# Patient Record
Sex: Female | Born: 1980 | Race: Black or African American | Hispanic: No | Marital: Married | Smoking: Never smoker
Health system: Southern US, Community
[De-identification: ages and names within clinical notes are randomized; demographics above are authoritative.]

## PROBLEM LIST (undated history)

## (undated) ENCOUNTER — Inpatient Hospital Stay (HOSPITAL_COMMUNITY): Payer: Self-pay

## (undated) DIAGNOSIS — I456 Pre-excitation syndrome: Secondary | ICD-10-CM

## (undated) HISTORY — PX: EYE SURGERY: SHX253

---

## 2005-11-13 DIAGNOSIS — I456 Pre-excitation syndrome: Secondary | ICD-10-CM

## 2005-11-13 HISTORY — PX: ABLATION: SHX5711

## 2005-11-13 HISTORY — DX: Pre-excitation syndrome: I45.6

## 2011-04-03 ENCOUNTER — Other Ambulatory Visit (HOSPITAL_COMMUNITY)
Admission: RE | Admit: 2011-04-03 | Discharge: 2011-04-03 | Disposition: A | Discharge status: Home / Self Care | Payer: BC Managed Care – PPO | Source: Ambulatory Visit | Attending: Obstetrics and Gynecology | Admitting: Obstetrics and Gynecology

## 2011-04-03 DIAGNOSIS — Z124 Encounter for screening for malignant neoplasm of cervix: Secondary | ICD-10-CM | POA: Insufficient documentation

## 2011-11-14 NOTE — L&D Delivery Note (Signed)
Delivery Note At 3:16 AM a non-viable female at 15 wks and 5 days  was delivered via Vaginal, Spontaneous Delivery (Presentation: ;  ).  APGAR: , ; weight .   Placenta status: intact, removed with ring forceps  .  Cord:  with the following complications: .  Cord pH: NA  Anesthesia: Epidural  Episiotomy: none Lacerations:None  Suture Repair: none Est. Blood Loss (mL): 250  Mom to to discharge from labor and delivery after 6 hours if stable .  Baby to NA.  Emily Coles J. 10/03/2012, 4:17 AM

## 2012-03-20 ENCOUNTER — Ambulatory Visit: Payer: BC Managed Care – PPO | Admitting: Family Medicine

## 2012-03-20 VITALS — BP 106/68 | HR 75 | Temp 97.9°F | Resp 18 | Ht 67.0 in | Wt 135.0 lb

## 2012-03-20 DIAGNOSIS — J029 Acute pharyngitis, unspecified: Secondary | ICD-10-CM

## 2012-03-20 DIAGNOSIS — J339 Nasal polyp, unspecified: Secondary | ICD-10-CM

## 2012-03-20 DIAGNOSIS — J00 Acute nasopharyngitis [common cold]: Secondary | ICD-10-CM

## 2012-03-20 DIAGNOSIS — J302 Other seasonal allergic rhinitis: Secondary | ICD-10-CM | POA: Insufficient documentation

## 2012-03-20 LAB — POCT RAPID STREP A (OFFICE): Rapid Strep A Screen: NEGATIVE

## 2012-03-20 MED ORDER — AZITHROMYCIN 250 MG PO TABS: ORAL_TABLET | ORAL | Status: AC

## 2012-03-20 NOTE — Patient Instructions (Signed)
Continue using the Allegra. Drink lots of fluids. Use the nose spray 2 sprays each nostril daily. Use alternative contraception while on antibiotics since she on birth control pills.  Return if worse.

## 2012-03-20 NOTE — Progress Notes (Signed)
Subjective: 31 year old with history of having felt bad since this weekend. Yesterday she had very bad sore throat. She feels a little better there today. She's been blowing bloody purulent mucus from her nose especially in the mornings. She been taking some OTC Allegra for her allergies. Ears do not bother her she has been coughing a fair amount. Says he wheezes on the mornings. She does have a history of annual springtime allergy.  Objective  pleasant Afro-American lady in no acute distress at this time. Her TMs are normal. Nose inflamed looking worse on the left than the right. Throat was clear strep screen taken neck supple without significant nodes. Chest clear to auscultation. Heart regular without a murmur scalp tenderness. 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  Assessment: Pharyngitis URI and cough History of WPW, treated with ablation  Plan:  Strep screen and then decide  Results for orders placed in visit on 03/20/12  POCT RAPID STREP A (OFFICE)      Component Value Range   Rapid Strep A Screen Negative  Negative    Rhinitis and sinusitis will be treated with a Z-Pak and Flonase. She is to continue the Allegra.

## 2012-04-25 ENCOUNTER — Other Ambulatory Visit (HOSPITAL_COMMUNITY)
Admission: RE | Admit: 2012-04-25 | Discharge: 2012-04-25 | Disposition: A | Discharge status: Home / Self Care | Payer: BC Managed Care – PPO | Source: Ambulatory Visit | Attending: Obstetrics & Gynecology | Admitting: Obstetrics & Gynecology

## 2012-04-25 DIAGNOSIS — Z1159 Encounter for screening for other viral diseases: Secondary | ICD-10-CM | POA: Insufficient documentation

## 2012-04-25 DIAGNOSIS — Z124 Encounter for screening for malignant neoplasm of cervix: Secondary | ICD-10-CM | POA: Insufficient documentation

## 2012-09-28 ENCOUNTER — Inpatient Hospital Stay (HOSPITAL_COMMUNITY)
Admission: AD | Admit: 2012-09-28 | Discharge: 2012-10-03 | DRG: 380 | Disposition: A | Discharge status: Home / Self Care | Payer: BC Managed Care – PPO | Source: Ambulatory Visit | Attending: Obstetrics & Gynecology | Admitting: Obstetrics & Gynecology

## 2012-09-28 ENCOUNTER — Encounter (HOSPITAL_COMMUNITY): Payer: Self-pay | Admitting: Obstetrics and Gynecology

## 2012-09-28 ENCOUNTER — Inpatient Hospital Stay (HOSPITAL_COMMUNITY): Payer: BC Managed Care – PPO

## 2012-09-28 DIAGNOSIS — O3430 Maternal care for cervical incompetence, unspecified trimester: Secondary | ICD-10-CM | POA: Diagnosis present

## 2012-09-28 DIAGNOSIS — O034 Incomplete spontaneous abortion without complication: Principal | ICD-10-CM | POA: Diagnosis not present

## 2012-09-28 DIAGNOSIS — Z332 Encounter for elective termination of pregnancy: Secondary | ICD-10-CM

## 2012-09-28 DIAGNOSIS — R109 Unspecified abdominal pain: Secondary | ICD-10-CM | POA: Diagnosis present

## 2012-09-28 DIAGNOSIS — K5900 Constipation, unspecified: Secondary | ICD-10-CM | POA: Diagnosis present

## 2012-09-28 DIAGNOSIS — D259 Leiomyoma of uterus, unspecified: Secondary | ICD-10-CM | POA: Diagnosis present

## 2012-09-28 DIAGNOSIS — O3410 Maternal care for benign tumor of corpus uteri, unspecified trimester: Secondary | ICD-10-CM | POA: Diagnosis present

## 2012-09-28 DIAGNOSIS — R630 Anorexia: Secondary | ICD-10-CM | POA: Diagnosis not present

## 2012-09-28 DIAGNOSIS — K219 Gastro-esophageal reflux disease without esophagitis: Secondary | ICD-10-CM | POA: Diagnosis not present

## 2012-09-28 HISTORY — DX: Pre-excitation syndrome: I45.6

## 2012-09-28 LAB — CBC
Hemoglobin: 11.1 g/dL — ABNORMAL LOW (ref 12.0–15.0)
MCH: 31.6 pg (ref 26.0–34.0)
MCV: 91.2 fL (ref 78.0–100.0)
RBC: 3.51 MIL/uL — ABNORMAL LOW (ref 3.87–5.11)
WBC: 7.8 10*3/uL (ref 4.0–10.5)

## 2012-09-28 LAB — CBC WITH DIFFERENTIAL/PLATELET
Basophils Absolute: 0 10*3/uL (ref 0.0–0.1)
HCT: 31.3 % — ABNORMAL LOW (ref 36.0–46.0)
Hemoglobin: 11 g/dL — ABNORMAL LOW (ref 12.0–15.0)
Lymphocytes Relative: 16 % (ref 12–46)
Monocytes Absolute: 0.9 10*3/uL (ref 0.1–1.0)
Monocytes Relative: 10 % (ref 3–12)
Neutro Abs: 6.6 10*3/uL (ref 1.7–7.7)
RDW: 12 % (ref 11.5–15.5)
WBC: 9 10*3/uL (ref 4.0–10.5)

## 2012-09-28 MED ORDER — ONDANSETRON HCL 4 MG/2ML IJ SOLN
4.0000 mg | Freq: Once | INTRAMUSCULAR | Status: AC
  Administered 2012-09-28: 4 mg via INTRAVENOUS
  Filled 2012-09-28: qty 2

## 2012-09-28 MED ORDER — ZOLPIDEM TARTRATE 5 MG PO TABS
5.0000 mg | ORAL_TABLET | Freq: Every evening | ORAL | Status: DC | PRN
  Administered 2012-09-29 – 2012-09-30 (×3): 5 mg via ORAL
  Filled 2012-09-28 (×3): qty 1

## 2012-09-28 MED ORDER — DOCUSATE SODIUM 100 MG PO CAPS
100.0000 mg | ORAL_CAPSULE | Freq: Every day | ORAL | Status: DC
  Administered 2012-09-29 – 2012-10-01 (×3): 100 mg via ORAL
  Filled 2012-09-28 (×4): qty 1

## 2012-09-28 MED ORDER — HYDROMORPHONE HCL PF 1 MG/ML IJ SOLN
0.5000 mg | Freq: Once | INTRAMUSCULAR | Status: AC
  Administered 2012-09-28: 0.5 mg via INTRAVENOUS
  Filled 2012-09-28: qty 1

## 2012-09-28 MED ORDER — CALCIUM CARBONATE ANTACID 500 MG PO CHEW: 2.0000 | CHEWABLE_TABLET | ORAL | Status: DC | PRN

## 2012-09-28 MED ORDER — SODIUM CHLORIDE 0.9 % IV BOLUS (SEPSIS)
1000.0000 mL | Freq: Once | INTRAVENOUS | Status: AC
  Administered 2012-09-28: 1000 mL via INTRAVENOUS

## 2012-09-28 MED ORDER — IBUPROFEN 600 MG PO TABS: 600.0000 mg | ORAL_TABLET | Freq: Four times a day (QID) | ORAL | Status: DC | PRN

## 2012-09-28 MED ORDER — IBUPROFEN 800 MG PO TABS
800.0000 mg | ORAL_TABLET | Freq: Three times a day (TID) | ORAL | Status: AC
  Administered 2012-09-29 – 2012-10-01 (×9): 800 mg via ORAL
  Filled 2012-09-28 (×9): qty 1

## 2012-09-28 MED ORDER — POLYETHYLENE GLYCOL 3350 17 G PO PACK
17.0000 g | PACK | Freq: Every day | ORAL | Status: DC
  Administered 2012-09-29 – 2012-09-30 (×2): 17 g via ORAL
  Filled 2012-09-28 (×6): qty 1

## 2012-09-28 MED ORDER — ACETAMINOPHEN 325 MG PO TABS: 650.0000 mg | ORAL_TABLET | ORAL | Status: DC | PRN

## 2012-09-28 MED ORDER — PRENATAL MULTIVITAMIN CH
1.0000 | ORAL_TABLET | Freq: Every day | ORAL | Status: DC
  Administered 2012-09-29 – 2012-10-02 (×4): 1 via ORAL
  Filled 2012-09-28 (×4): qty 1

## 2012-09-28 MED ORDER — DEXTROSE IN LACTATED RINGERS 5 % IV SOLN
INTRAVENOUS | Status: DC
  Administered 2012-09-29: 01:00:00 via INTRAVENOUS

## 2012-09-28 MED ORDER — HYDROMORPHONE HCL PF 1 MG/ML IJ SOLN
1.0000 mg | INTRAMUSCULAR | Status: DC | PRN
  Administered 2012-09-29: 1 mg via INTRAVENOUS
  Filled 2012-09-28: qty 1

## 2012-09-28 NOTE — MAU Provider Note (Signed)
History     CSN: [id number]  Arrival date and time: 09/28/12 1803   First Provider Initiated Contact with Patient 09/28/12 1834      Chief Complaint  Patient presents with  . Vaginal Bleeding   HPI Emily Roman is a 31 y.o. female @ [redacted]w[redacted]d gestation who presents to MAU via EMS with vaginal bleeding. The onset was sudden. She describes the bleeding as heavy with clots. Associated symptoms include severe abdominal pain and nausea. The history was provided by the patient.  OB History    Grav Para Term Preterm Abortions TAB SAB Ect Mult Living   1 0 0 0 0 0 0 0 0 0       Past Medical History  Diagnosis Date  . Wolff-Parkinson-White (WPW) syndrome     Past Surgical History  Procedure Date  . Eye surgery   . Ablation     History reviewed. No pertinent family history.  History  Substance Use Topics  . Smoking status: Never Smoker   . Smokeless tobacco: Not on file  . Alcohol Use: No    Allergies: No Known Allergies  Prescriptions prior to admission  Medication Sig Dispense Refill  . fexofenadine (ALLEGRA) 180 MG tablet Take 180 mg by mouth daily.        Review of Systems  Constitutional: Positive for malaise/fatigue. Negative for fever and chills.  Eyes: Negative for blurred vision and double vision.  Respiratory: Negative for cough and wheezing.   Cardiovascular: Negative for chest pain and palpitations.  Gastrointestinal: Positive for nausea and abdominal pain. Negative for vomiting, diarrhea and constipation.  Genitourinary: Positive for frequency. Negative for dysuria.       Vaginal bleeding  Musculoskeletal: Positive for back pain.  Skin: Negative for rash.  Neurological: Positive for dizziness. Negative for headaches.  Psychiatric/Behavioral: Negative for depression. The patient is not nervous/anxious.    Physical Exam   Blood pressure 126/72, pulse 103, temperature 98.3 F (36.8 C), temperature source Oral, resp. rate 20, last menstrual period  03/05/2012.  Physical Exam  Nursing note and vitals reviewed. Constitutional: She is oriented to person, place, and time. She appears well-developed and well-nourished. No distress.  HENT:  Head: Normocephalic and atraumatic.  Eyes: EOM are normal.  Neck: Neck supple.  Cardiovascular:       tachycardia  Respiratory: Effort normal.  GI: Soft. There is tenderness in the right upper quadrant, right lower quadrant, periumbilical area and left lower quadrant. There is guarding.       The abdomen is firm  Genitourinary:       External genitalia without lesions. Moderate blood vaginal vault. Cervix external os fingertip. Uterus firm, tender.  Neurological: She is alert and oriented to person, place, and time.  Skin: Skin is warm and dry.  Psychiatric: She has a normal mood and affect. Her behavior is normal. Judgment and thought content normal.   Results for orders placed during the hospital encounter of 09/28/12 (from the past 24 hour(s))  CBC WITH DIFFERENTIAL     Status: Abnormal   Collection Time   09/28/12  6:27 PM      Component Value Range   WBC 9.0  4.0 - 10.5 K/uL   RBC 3.42 (*) 3.87 - 5.11 MIL/uL   Hemoglobin 11.0 (*) 12.0 - 15.0 g/dL   HCT 40.9 (*) 81.1 - 91.4 %   MCV 91.5  78.0 - 100.0 fL   MCH 32.2  26.0 - 34.0 pg   MCHC 35.1  30.0 -  36.0 g/dL   RDW 16.1  09.6 - 04.5 %   Platelets 267  150 - 400 K/uL   Neutrophils Relative 73  43 - 77 %   Neutro Abs 6.6  1.7 - 7.7 K/uL   Lymphocytes Relative 16  12 - 46 %   Lymphs Abs 1.5  0.7 - 4.0 K/uL   Monocytes Relative 10  3 - 12 %   Monocytes Absolute 0.9  0.1 - 1.0 K/uL   Eosinophils Relative 0  0 - 5 %   Eosinophils Absolute 0.0  0.0 - 0.7 K/uL   Basophils Relative 0  0 - 1 %   Basophils Absolute 0.0  0.0 - 0.1 K/uL  ABO/RH     Status: Normal   Collection Time   09/28/12  6:27 PM      Component Value Range   ABO/RH(D) O POS    WET PREP, GENITAL     Status: Abnormal   Collection Time   09/28/12  6:50 PM      Component  Value Range   Yeast Wet Prep HPF POC NONE SEEN  NONE SEEN   Trich, Wet Prep NONE SEEN  NONE SEEN   Clue Cells Wet Prep HPF POC NONE SEEN  NONE SEEN   WBC, Wet Prep HPF POC MODERATE BACTERIA SEEN (*) NONE SEEN   MAU Course: discussed with Dr. Dion Body on call for Dr. Christell Constant. We will repeat the CBC and she will evaluate the patient in MAU.  Procedures  Assessment: 31 y.o. female @ [redacted]w[redacted]d with heavy vaginal bleeding   Abdominal pain.  NEESE,HOPE, RN, FNP, [facility] 09/28/2012, 6:37 PM

## 2012-09-28 NOTE — MAU Note (Signed)
Pt presents to MAU via EMS with chief complaint of vaginal bleeding. Pt is 15w pregnancy; says around 1700 she felt a gush of blood and called 911. Pt has not been feeling well since Monday due to pain from fibroids.

## 2012-09-28 NOTE — H&P (Signed)
Emily Roman is a 31 y.o. female G1 at 52 0/7 weeks presenting with a c/o heavy vaginal bleeding.  Pt has a h/o multiple (7) large fibroids.  The largest is 12 cm.    She reports a sudden gush of blood tonight when she was urinating.  Bleeding continued pretty heavily so she called EMS.  She initially reports dizziness at home but denies it upon arrival to hospital. She has been having pain for 7 days.  She initially took Ibuprofen that helped but discontinued by her primary OB, Dr. Christell Constant with [facility].  She was given Percocet 4 days ago, which did not help with pain.  Pt reports when she moves it 8/10 pain, if she is still 4/10 pain.  She got IV Dilaudid which brought the pain down to 5/10.  Husband states she pt has slept on the couch for the last 4 nights.  He states she could not stand up straight when walking.  He rolled her to the bathroom in a chair. She reports she has not eaten in the last 5 days due to decreased appetite.  She states it is due to pain.  Denies nausea or vomiting.  She denies fever or chills.   Pt was previously scheduled for a laparoscopic myomectomy prior to spontaneous conception.  Besides the fibroids, she has not had any more issues.  Prenatal record is not available for review.  Maternal Medical History:  Reason for admission: Reason for admission: vaginal bleeding.  Reason for Admission:   nauseaPrenatal complications: Large uterine fibroids.  Prenatal Complications - Diabetes: none.    OB History    Grav Para Term Preterm Abortions TAB SAB Ect Mult Living   1 0 0 0 0 0 0 0 0 0      Past Medical History  Diagnosis Date  . Wolff-Parkinson-White (WPW) syndrome    Past Surgical History  Procedure Date  . Eye surgery   . Ablation    Family History: family history is not on file. Social History:  reports that she has never smoked. She does not have any smokeless tobacco history on file. She reports that she does not drink alcohol or use illicit  drugs.   Review of Systems  Constitutional: Negative for fever and chills.  Respiratory: Negative for shortness of breath.   Cardiovascular: Negative for palpitations.  Gastrointestinal: Positive for abdominal pain and constipation. Negative for nausea and vomiting.       Decreased appetite x 5 days.  Neurological: Positive for dizziness.  Psychiatric/Behavioral: The patient is not nervous/anxious.     Dilation: Fingertip Exam by:: Ivonne Andrew, CNM Blood pressure 109/65, pulse 99, temperature 98.3 F (36.8 C), temperature source Oral, resp. rate 20, last menstrual period 03/05/2012, SpO2 99.00%. Maternal Exam:  Abdomen: Fetal presentation: breech  Introitus: Normal vulva. Normal vagina.  Ferning test: negative.   Cervix: Cervix evaluated by sterile speculum exam and digital exam.     Fetal Exam Fetal Monitor Review: Mode: ultrasound.   Baseline rate: 160s.      Physical Exam  Constitutional: She is oriented to person, place, and time. She appears well-developed and well-nourished. No distress.       Pt right leg occasionally twitches which she reports is due to pain.  HENT:  Head: Normocephalic and atraumatic.  Cardiovascular: Normal rate, regular rhythm and normal heart sounds.   No murmur heard. Respiratory: Effort normal and breath sounds normal. She has no wheezes. She exhibits no tenderness.  GI: She exhibits  distension and mass. There is tenderness.       Pt is exquisitely tender over a large right fundal fibroid.  Musculoskeletal: Normal range of motion. She exhibits no edema.  Neurological: She is alert and oriented to person, place, and time.  Skin: Skin is warm and dry. She is not diaphoretic.  Psychiatric:       Pt is very calm.    Cervix is visually and digitally closed.  Mucus noted at os.  No active bleeding and no blood in vault.  Small dark clots noted on pad under pt. Bedside ultrasound done to assess area where pt has the most pain.  10 cm fibroid  noted.  6.5 cm noted in left mid uterus. Breech fetus with cardiac activity. I reviewed previous ultrasound images.  Funneling of internal os is obvious.  Foot through internal os.    Hg stable 11.1 to 11.2 over 3 hours. Assessment/Plan: IUP at 15 0/7 weeks Vaginal bleeding, acute but now has slowed.  Hg stable, vital signs are stable.  Suspect abruption, labor or subchorionic hemorrhage. Large uterine fibroids, likely degenerating, which is cause of pain. 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 Poor po intake due to pain per pt.  In that pain is RLQ, consider appendicitis.  B/c pt is very sure about location of pain which is over fibroid, appendicitis is less likely.  Currently afebrile. Constipation.  Due to narcotics.  Admit for observation and pain control. Ibuprofen 800 mg q 8 hours x 72 hours for pain.  Dilaudid for severe pain. DIC panel, KB stain, T&S. Strict bedrest. Miralax for now for constipation.  Dulcolax prn. If pain is uncontrolled or pt becomes febrile, consider CT scan.  Discussed with pt and husband plan of care.  Not a candidate for cerclage due to bleeding.  Poor prognosis given bleeding, large fibroids and funneling. Goal is to get her comfortable for now and expectantly manage. Plan reviewed with Dr. Rachel Bo, MFM on call.  Agrees with management plan.      Geryl Rankins 09/28/2012, 11:11 PM

## 2012-09-29 LAB — DIC (DISSEMINATED INTRAVASCULAR COAGULATION)PANEL
Fibrinogen: 599 mg/dL — ABNORMAL HIGH (ref 204–475)
Prothrombin Time: 13.6 seconds (ref 11.6–15.2)
Smear Review: NONE SEEN

## 2012-09-29 LAB — TYPE AND SCREEN: ABO/RH(D): O POS

## 2012-09-29 LAB — CBC WITH DIFFERENTIAL/PLATELET
Eosinophils Absolute: 0.1 10*3/uL (ref 0.0–0.7)
Eosinophils Relative: 2 % (ref 0–5)
Hemoglobin: 10.5 g/dL — ABNORMAL LOW (ref 12.0–15.0)
Lymphs Abs: 1.2 10*3/uL (ref 0.7–4.0)
MCH: 31.4 pg (ref 26.0–34.0)
MCV: 91 fL (ref 78.0–100.0)
Monocytes Relative: 10 % (ref 3–12)
RBC: 3.34 MIL/uL — ABNORMAL LOW (ref 3.87–5.11)

## 2012-09-29 LAB — COMPREHENSIVE METABOLIC PANEL
AST: 11 U/L (ref 0–37)
Albumin: 2.4 g/dL — ABNORMAL LOW (ref 3.5–5.2)
Calcium: 8.7 mg/dL (ref 8.4–10.5)
Creatinine, Ser: 0.65 mg/dL (ref 0.50–1.10)
Sodium: 132 mEq/L — ABNORMAL LOW (ref 135–145)
Total Protein: 6.1 g/dL (ref 6.0–8.3)

## 2012-09-29 LAB — URINALYSIS, ROUTINE W REFLEX MICROSCOPIC
Glucose, UA: NEGATIVE mg/dL
Leukocytes, UA: NEGATIVE
Nitrite: NEGATIVE
Protein, ur: NEGATIVE mg/dL

## 2012-09-29 LAB — KLEIHAUER-BETKE STAIN
Fetal Cells %: 0 %
Quantitation Fetal Hemoglobin: 0 mL

## 2012-09-29 MED ORDER — FAMOTIDINE IN NACL 20-0.9 MG/50ML-% IV SOLN
20.0000 mg | Freq: Once | INTRAVENOUS | Status: AC
  Administered 2012-09-29: 20 mg via INTRAVENOUS
  Filled 2012-09-29: qty 50

## 2012-09-29 MED ORDER — LACTATED RINGERS IV SOLN
INTRAVENOUS | Status: DC
  Administered 2012-09-29 – 2012-10-01 (×6): via INTRAVENOUS

## 2012-09-29 MED ORDER — FAMOTIDINE 20 MG PO TABS
20.0000 mg | ORAL_TABLET | Freq: Two times a day (BID) | ORAL | Status: DC
  Administered 2012-09-29 – 2012-10-02 (×5): 20 mg via ORAL
  Filled 2012-09-29 (×5): qty 1

## 2012-09-29 NOTE — Progress Notes (Addendum)
Patient ID: Emily Roman, female   DOB: [birth year], 31 y.o.   MRN: [id number]  Pt states pain is decreasing.  She has not used Dilaudid this am.  It still hurts to move or laugh on her right side near umbilicus. Passed a small dark clot this am but she has not any more bright red blood.  Not wearing a pad.  She ate a light breakfast this am and is drinking.  Starting to have flatus but has not had BM yet.  Denies n/v but some discomfort with swallowing.   AF VSS.  HR 119. FHTS: 140s per RN. Gen:  NAD, feels much more comfortable.   CV:  RRR, tachycardia, no murmer Lungs:  CTA bilaterally Abodmen:  Still tender over the mid right abdomen over fibroid but significantly improved.  She is now able to tolerate mild palpation.Still distended but significantly less taut, active BS.   Ext:  TED hose on.  No calf tenderness. Perineum:  No blood noted. Small dark clot less than 1 cm visualized on washcloth.  A/P:   IUP at 15 1/7 weeks- Large fibroid uterus with vaginal bleeding and severe abdominal pain.  Pt clinically appears much better.  Pain likely due to degenerating fibroid.    -Pain control is improving with Ibuprofen scheduled.  Continue to complete 72 hours. Vaginal bleeding has stopped.  Labs did not rule in abruption.    -Continue observation, pad count. IC-  No signs of progressing cervical dilatation.   -MFM consult tomorrow to reevaluate cervical length, funneling. Anorexia improving.  Tolerating po.    -Cont IVF, change to LR.  Saline lock when po normalizes. Constipation.  +Flatus but no BM yet.    Cont IVF and Miralax.  Consider Dulcolax suppository this evening prn. Possible GERD.  Start Pepcid. Tachycardia at rest.  Check CBC. Continue strict bedrest until further recommendations from MFM or Dr. Christell Constant. Plan discussed at length with pt, husband and family at bedside.  All questions answered.  Dr. Christell Constant to resume care tomorrow am.

## 2012-09-29 NOTE — Progress Notes (Signed)
Report given to Lowella Grip, RN. Current RN sent to MAU due to low census.

## 2012-09-29 NOTE — Progress Notes (Addendum)
Patient ID: Emily Roman, female   DOB: 14-May-1981, 31 y.o.   MRN: [id number]  At bedside in L&D to discuss MFM recommendations and management plan. Also discussed risk of retained placenta and possibility of D&C for retained placenta. All questions answered.' Pt reports epigastric pain when drinking Sprite.  "Hurts as it is going through." Coags normal. KB negative. UA neg except trace Ketones. Add on CMP.

## 2012-09-30 ENCOUNTER — Ambulatory Visit (HOSPITAL_COMMUNITY)
Admission: RE | Admit: 2012-09-30 | Payer: BC Managed Care – PPO | Source: Ambulatory Visit | Admitting: Obstetrics and Gynecology

## 2012-09-30 ENCOUNTER — Encounter (HOSPITAL_COMMUNITY): Admission: RE | Payer: Self-pay | Source: Ambulatory Visit

## 2012-09-30 ENCOUNTER — Inpatient Hospital Stay (HOSPITAL_COMMUNITY): Payer: BC Managed Care – PPO

## 2012-09-30 LAB — GC/CHLAMYDIA PROBE AMP, GENITAL: Chlamydia, DNA Probe: NEGATIVE

## 2012-09-30 SURGERY — LAPAROSCOPY OPERATIVE
Anesthesia: General

## 2012-09-30 NOTE — Consult Note (Signed)
Maternal Fetal Medicine Consultation  Requesting Provider(s): Filbert Berthold, MD  Reason for consultation: vaginal bleeding, shortened cervix, abdominal pain  HPI: Emily Roman is a 31 yo G1P0 currently at 15 2/7 weeks who was admitted over the weekend for abdominal pain and vaginal bleeding.  She reports that over the last week, she has had progressively worsening right-sided abdominal pain that is associated with any activity. Her appetite has been poor.  She was seen by Dr. Christell Constant last Wednesday and given a script for Percocet with only a little improvement.  On the day of admission (2 days ago) she reported worsening abdominal pain and "heavy" vaginal bleeding with passage of some clots.  Since admission, her pain has improved somewhat and the vaginal bleeding has almost resolved - now with mostly mucous discharge.  The patient's prenatal course has been complicated by fibroid uterus - has a 10 x 10 cm right-sided fibroid that has been the source of pain.  Prenatal course has otherwise been uncomplicated.  OB History: OB History    Grav Para Term Preterm Abortions TAB SAB Ect Mult Living   1 0 0 0 0 0 0 0 0 0       PMH:  Past Medical History  Diagnosis Date  . Wolff-Parkinson-White (WPW) syndrome     PSH:  Past Surgical History  Procedure Date  . Eye surgery   . Ablation    Meds:  Scheduled Meds:   . docusate sodium  100 mg Oral Daily  . [COMPLETED] famotidine (PEPCID) IV  20 mg Intravenous Once  . famotidine  20 mg Oral BID  . ibuprofen  800 mg Oral Q8H  . polyethylene glycol  17 g Oral Daily  . prenatal multivitamin  1 tablet Oral Daily   Continuous Infusions:   . dextrose 5% lactated ringers 125 mL/hr at 09/29/12 0108  . lactated ringers 125 mL/hr at 09/30/12 0616   PRN Meds:.acetaminophen, calcium carbonate, HYDROmorphone, zolpidem  Allergies: NKDA  FH:  Denies family history of birth defects or hereditary disorders  Soc: Non drinker, non smoker, denies illicit  drug use  Review of Systems: denies contractions, no LOF, no nausea/vomiting. All other systems reviewed and are negative.   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  Some debris noted in the amniotic fluid.               Unable to measure cervical length.  Labs: CBC    Component Value Date/Time   WBC 6.8 09/29/2012 1326   RBC 3.34* 09/29/2012 1326   HGB 10.5* 09/29/2012 1326   HCT 30.4* 09/29/2012 1326   PLT 248 09/29/2012 1326   MCV 91.0 09/29/2012 1326   MCH 31.4 09/29/2012 1326   MCHC 34.5 09/29/2012 1326   RDW 11.8 09/29/2012 1326   LYMPHSABS 1.2 09/29/2012 1326   MONOABS 0.7 09/29/2012 1326   EOSABS 0.1 09/29/2012 1326   BASOSABS 0.0 09/29/2012 1326     A/P: 1) IUP at 15 2/7 weeks         2) Vaginal bleeding  3) Fibroid uterus         4) RLQ abdominal pain - likely secondary to degenerating fibroids; with normal white count, doubt appendicitis         5) Shortened cervix - possible cervical insufficiency  Recommendations: Findings and limitations of the ultrasound were discussed with the patient.  With vaginal bleeding and abdominal pain, do not feel that the patient is a candidate for rescue cerclage and the risk of harm outweighs potential benefits.  Recommend continued expectant management.  We discussed the guarded prognosis for the fetus given ultrasound findings and risk of infection, PROM and likely preterm / previable delivery.  The patient's vaginal bleeding has improved - currently hemodynamically stable.  Feel that the most likely etiology of the  patient's abdominal pain is due to degenerating fibroids.  May continue Motrin for 48-72 hours (total).  Alternatively, could try a single dose of IV Toradol - but again would restrict dose of NSAIDs to maximum of 72 hours.  With a normal WBC count, feel that likelihood of appendicitis is low, but would consider General Surgery consult / CT scan if symptoms worsen.  If able to get pain under better control, would consider hospital discharge with close outpatient management / frequent temp checks at home.  Would consider readmission once viability is reached (approx 23-24 weeks) for observation and betamethasone.  Would consider myomectomy as an interval procedure - would recommend cervical length surveillance due to possible cervical insufficiency in future pregnancies.  Thank you for the opportunity to be a part of the care of [employer]. Please contact our office if we can be of further assistance.   I spent approximately 30 minutes with this patient with over 50% of time spent in face-to-face counseling.  Alpha Gula, MD

## 2012-09-30 NOTE — Progress Notes (Signed)
Hospital Day: 3  S: Preterm labor symptoms: No complaints of bleeding today.  Still has pain, especially if she moves around much.  Ibuprofen 800 mg helps.  O: Blood pressure 119/81, pulse 85, temperature 98.4 F (36.9 C), temperature source Oral, resp. rate 18, height 5\' 7"  (1.702 m), weight 67.132 kg (148 lb), last menstrual period 03/05/2012, SpO2 99.00%.   FHT:wnl Toco: None [id number]: Fingertip Exam by:: Ivonne Andrew, CNM  A/P- 31 y.o. admitted with 15 + wk pregnancy, pain, bleeding.  Pt with massive fibroids, probable degenerating fibroid. Dr Sondra Barges note appreciated.  Will continue present plan of bedrest and pain control.  Discharge plans per Dr Christell Constant.  Patient Active Hospital Problem List: No active hospital problems.   Pregnancy Complications: degenerating fibroid  Preterm labor management: no treatment necessary ROD: spontaneous vaginal

## 2012-10-01 MED ORDER — SODIUM CHLORIDE 0.9 % IJ SOLN
3.0000 mL | Freq: Two times a day (BID) | INTRAMUSCULAR | Status: DC
  Administered 2012-10-01: 3 mL via INTRAVENOUS

## 2012-10-01 NOTE — Progress Notes (Addendum)
Patient ID: Emily Roman, female   DOB: [birth year], 31 y.o.   MRN: [id number] Hospital Day: 4  S: Preterm labor symptoms: of RLQ pain but no pain when still and was able to sleep last evening but when she moves she states the pain is severe.  She also states that she is scared to go home and we discussed the difficulty with her situation. We will let her process this difficult news of the poor prognosis.  We are also going to have to stop the motrin after 72 hours. Not eating well.    O: Blood pressure 122/71, pulse 84, temperature 98 F (36.7 C), temperature source Oral, resp. rate 18, height 5\' 7"  (1.702 m), weight 67.132 kg (148 lb), last menstrual period 03/05/2012, SpO2 99.00%.   [id number] Toco: None [id number]: Fingertip Exam by:: Ivonne Andrew, CNM Appears anxious and upset but was awakened from sleep Abdomen nonacute and tender where the palpable fibroid is located Stockings on  Scheduled Meds:   . docusate sodium  100 mg Oral Daily  . famotidine  20 mg Oral BID  . ibuprofen  800 mg Oral Q8H  . polyethylene glycol  17 g Oral Daily  . prenatal multivitamin  1 tablet Oral Daily   Continuous Infusions:   . dextrose 5% lactated ringers 125 mL/hr at 09/29/12 0108  . lactated ringers 125 mL/hr at 10/01/12 0541   PRN Meds:.acetaminophen, calcium carbonate, HYDROmorphone, zolpidem  CBC    Component Value Date/Time   WBC 6.8 09/29/2012 1326   RBC 3.34* 09/29/2012 1326   HGB 10.5* 09/29/2012 1326   HCT 30.4* 09/29/2012 1326   PLT 248 09/29/2012 1326   MCV 91.0 09/29/2012 1326   MCH 31.4 09/29/2012 1326   MCHC 34.5 09/29/2012 1326   RDW 11.8 09/29/2012 1326   LYMPHSABS 1.2 09/29/2012 1326   MONOABS 0.7 09/29/2012 1326   EOSABS 0.1 09/29/2012 1326   BASOSABS 0.0 09/29/2012 1326      A/P- 31 y.o. admitted with:  Patient Active Hospital Problem List: No active hospital problems.   Pregnancy Complications: IUP at 15w3  Fibroid uterus H/o WPW ablated Right sided pain  secondary to myoma  Shortened cervix??    Pain control Work toward outpatient management Encourage ambulation    Preterm labor management: pain medication Dating:  [redacted]w[redacted]d PNL Needed:  none

## 2012-10-01 NOTE — Progress Notes (Signed)
10/01/12 1100  Clinical Encounter Type  Visited With Patient  Visit Type Initial;Spiritual support;Social support  Recommendations Spiritual Care will follow for support.  Spiritual Encounters  Spiritual Needs Emotional  Stress Factors  Patient Stress Factors Loss of control;Major life changes (threatened pregnancy)    Made lovely initial visit with Emily Roman at her bedside RN's recommendation.  Emily Roman was very frank and open about her situation and feelings, stating that she has good support from family (husband, inlaws) and friends (including present coworkers from [employer] and past coworkers from [employer]), but that she's really struggling with the prognosis and the emotional limbo.  She appears to have strong communication skills and good self-awareness as she tries to balance hope for, and anxiety about losing, this much-wanted pregnancy.    I provided witness to her story, empathic listening, affirmation, and intro to chaplain services and availability.  Will continue to follow for support and encouraged [place] to reach out, too, as desired through this and possible future admissions.  [address] [place], [place] [phone]

## 2012-10-02 ENCOUNTER — Inpatient Hospital Stay (HOSPITAL_COMMUNITY): Payer: BC Managed Care – PPO | Admitting: Anesthesiology

## 2012-10-02 ENCOUNTER — Encounter (HOSPITAL_COMMUNITY): Payer: Self-pay | Admitting: Anesthesiology

## 2012-10-02 LAB — TYPE AND SCREEN: ABO/RH(D): O POS

## 2012-10-02 LAB — CBC
Hemoglobin: 11.8 g/dL — ABNORMAL LOW (ref 12.0–15.0)
MCH: 31.1 pg (ref 26.0–34.0)
RBC: 3.79 MIL/uL — ABNORMAL LOW (ref 3.87–5.11)
WBC: 7.1 10*3/uL (ref 4.0–10.5)

## 2012-10-02 MED ORDER — LACTATED RINGERS IV SOLN
500.0000 mL | Freq: Once | INTRAVENOUS | Status: AC
  Administered 2012-10-02: 500 mL via INTRAVENOUS

## 2012-10-02 MED ORDER — FENTANYL 2.5 MCG/ML BUPIVACAINE 1/10 % EPIDURAL INFUSION (WH - ANES)
14.0000 mL/h | INTRAMUSCULAR | Status: DC
  Administered 2012-10-02: 14 mL/h via EPIDURAL
  Filled 2012-10-02: qty 125

## 2012-10-02 MED ORDER — OXYCODONE-ACETAMINOPHEN 5-325 MG PO TABS
1.0000 | ORAL_TABLET | ORAL | Status: DC | PRN
  Administered 2012-10-03: 2 via ORAL
  Filled 2012-10-02: qty 2

## 2012-10-02 MED ORDER — ACETAMINOPHEN 325 MG PO TABS: 650.0000 mg | ORAL_TABLET | ORAL | Status: DC | PRN

## 2012-10-02 MED ORDER — ONDANSETRON HCL 4 MG/2ML IJ SOLN: 4.0000 mg | Freq: Four times a day (QID) | INTRAMUSCULAR | Status: DC | PRN

## 2012-10-02 MED ORDER — LIDOCAINE HCL (PF) 1 % IJ SOLN: 30.0000 mL | INTRAMUSCULAR | Status: DC | PRN

## 2012-10-02 MED ORDER — EPHEDRINE 5 MG/ML INJ: 10.0000 mg | INTRAVENOUS | Status: DC | PRN

## 2012-10-02 MED ORDER — DIPHENHYDRAMINE HCL 50 MG/ML IJ SOLN: 12.5000 mg | INTRAMUSCULAR | Status: DC | PRN

## 2012-10-02 MED ORDER — LIDOCAINE HCL (PF) 1 % IJ SOLN
INTRAMUSCULAR | Status: DC | PRN
  Administered 2012-10-02 (×3): 4 mL

## 2012-10-02 MED ORDER — PHENYLEPHRINE 40 MCG/ML (10ML) SYRINGE FOR IV PUSH (FOR BLOOD PRESSURE SUPPORT): 80.0000 ug | PREFILLED_SYRINGE | INTRAVENOUS | Status: DC | PRN

## 2012-10-02 MED ORDER — CITRIC ACID-SODIUM CITRATE 334-500 MG/5ML PO SOLN: 30.0000 mL | ORAL | Status: DC | PRN

## 2012-10-02 MED ORDER — OXYTOCIN BOLUS FROM INFUSION: 500.0000 mL | INTRAVENOUS | Status: DC

## 2012-10-02 MED ORDER — LACTATED RINGERS IV SOLN
INTRAVENOUS | Status: DC
  Administered 2012-10-02 (×2): via INTRAVENOUS

## 2012-10-02 MED ORDER — LACTATED RINGERS IV SOLN: 500.0000 mL | INTRAVENOUS | Status: DC | PRN

## 2012-10-02 MED ORDER — TERBUTALINE SULFATE 1 MG/ML IJ SOLN: 0.2500 mg | Freq: Once | INTRAMUSCULAR | Status: AC | PRN

## 2012-10-02 MED ORDER — OXYTOCIN 40 UNITS IN LACTATED RINGERS INFUSION - SIMPLE MED: 62.5000 mL/h | INTRAVENOUS | Status: DC

## 2012-10-02 MED ORDER — EPHEDRINE 5 MG/ML INJ
10.0000 mg | INTRAVENOUS | Status: DC | PRN
  Filled 2012-10-02: qty 4

## 2012-10-02 MED ORDER — OXYCODONE-ACETAMINOPHEN 5-325 MG PO TABS
1.0000 | ORAL_TABLET | ORAL | Status: DC | PRN
  Administered 2012-10-02: 1 via ORAL
  Filled 2012-10-02: qty 1

## 2012-10-02 MED ORDER — PHENYLEPHRINE 40 MCG/ML (10ML) SYRINGE FOR IV PUSH (FOR BLOOD PRESSURE SUPPORT)
80.0000 ug | PREFILLED_SYRINGE | INTRAVENOUS | Status: DC | PRN
  Filled 2012-10-02: qty 5

## 2012-10-02 MED ORDER — MISOPROSTOL 200 MCG PO TABS
800.0000 ug | ORAL_TABLET | Freq: Once | ORAL | Status: AC
  Administered 2012-10-02: 800 ug via VAGINAL
  Filled 2012-10-02: qty 4

## 2012-10-02 MED ORDER — IBUPROFEN 600 MG PO TABS
600.0000 mg | ORAL_TABLET | Freq: Four times a day (QID) | ORAL | Status: DC | PRN
  Administered 2012-10-03: 600 mg via ORAL
  Filled 2012-10-02: qty 1

## 2012-10-02 NOTE — Anesthesia Procedure Notes (Signed)
Epidural Patient location during procedure: OB Start time: 10/02/2012 10:23 PM  Staffing Performed by: anesthesiologist   Preanesthetic Checklist Completed: patient identified, site marked, surgical consent, pre-op evaluation, timeout performed, IV checked, risks and benefits discussed and monitors and equipment checked  Epidural Patient position: sitting Prep: site prepped and draped and DuraPrep Patient monitoring: continuous pulse ox and blood pressure Approach: midline Injection technique: LOR air  Needle:  Needle type: Tuohy  Needle gauge: 17 G Needle length: 9 cm and 9 Needle insertion depth: 4 cm Catheter type: closed end flexible Catheter size: 19 Gauge Catheter at skin depth: 9 cm Test dose: negative  Assessment Events: blood not aspirated, injection not painful, no injection resistance, negative IV test and no paresthesia  Additional Notes Discussed risk of headache, infection, bleeding, nerve injury and failed or incomplete block.  Patient voices understanding and wishes to proceed. Reason for block:procedure for pain

## 2012-10-02 NOTE — Progress Notes (Signed)
Pt states feels fluid leaking everytime she moves

## 2012-10-02 NOTE — Anesthesia Preprocedure Evaluation (Addendum)
Anesthesia Evaluation  Patient identified by MRN, date of birth, ID band Patient awake    Reviewed: Allergy & Precautions, H&P , NPO status , Patient's Chart, lab work & pertinent test results, reviewed documented beta blocker date and time   History of Anesthesia Complications Negative for: history of anesthetic complications  Airway Mallampati: III TM Distance: >3 FB Neck ROM: full    Dental  (+) Teeth Intact   Pulmonary neg pulmonary ROS,  breath sounds clear to auscultation        Cardiovascular + dysrhythmias (h/o WPW, s/p ablation in 2007, asymptomatic since) Rhythm:regular Rate:Normal     Neuro/Psych negative neurological ROS  negative psych ROS   GI/Hepatic negative GI ROS, Neg liver ROS,   Endo/Other  negative endocrine ROS  Renal/GU negative Renal ROS     Musculoskeletal   Abdominal   Peds  Hematology negative hematology ROS (+)   Anesthesia Other Findings   Reproductive/Obstetrics (+) Pregnancy (15 weeks,SROM)                          Anesthesia Physical Anesthesia Plan  ASA: II  Anesthesia Plan: Epidural   Post-op Pain Management:    Induction:   Airway Management Planned:   Additional Equipment:   Intra-op Plan:   Post-operative Plan:   Informed Consent: I have reviewed the patients History and Physical, chart, labs and discussed the procedure including the risks, benefits and alternatives for the proposed anesthesia with the patient or authorized representative who has indicated his/her understanding and acceptance.     Plan Discussed with:   Anesthesia Plan Comments:         Anesthesia Quick Evaluation

## 2012-10-02 NOTE — Progress Notes (Signed)
Pt denies any leaking

## 2012-10-02 NOTE — Progress Notes (Signed)
Oline Belk is a 31 y.o. G1P0000 at [redacted]w[redacted]d by [place] and has been admitted for fibroid pain  Subjective: Today pt had episode of leaking and amnisure was positive and the pt had leaked which was by report grossly appearing like amniiotic  fluiid as well.   The pt states she has stopped leaking and the FHR is still present and we discussed options.  We talked about the very poor prognosis and the best recommendation to proceed with delivery with expectation of what we may find.  We did discuss the possibilities of D&C to remove the pregnancy and or the placenta and or the potential for uterine rupture secondary to being slightly increased with the myomas.    We also discussed potential for bleeding and potential for transfusion and the risk of HIV , hepatitis and transfusion reaction.  The potential for D&C for this as well.  We discussed pain control with the CLE and she agrees.  We discussed the fact that we did not need abx at this point but they may be required as the time is the factor.    Objective: BP 110/67  Pulse 93  Temp 98.3 F (36.8 C) (Oral)  Resp 18  Ht 5\' 7"  (1.702 m)  Wt 65.046 kg (143 lb 6.4 oz)  BMI 22.46 kg/m2  SpO2 99%  LMP 03/05/2012 I/O last 3 completed shifts: In: -  Out: 7 [Urine:6; Stool:1] Total I/O In: -  Out: 400 [Urine:400]  FHT:  present UC:  SVE:   Dilation: Fingertip Exam by:: Ivonne Andrew, CNM  Labs: Lab Results  Component Value Date   WBC 6.8 09/29/2012   HGB 10.5* 09/29/2012   HCT 30.4* 09/29/2012   MCV 91.0 09/29/2012   PLT 248 09/29/2012    Assessment / Plan: IUP inevitable AB with ROM now and breech  Labor: to be induced with cytotec likely although we discussed the diffeent options including the potential for D&C Preeclampsia:  na  Pain Control:  Epidural I/D:  n/a Anticipated MOD:  NSVD TYpe and screen and cbc Sowle, Rayburn Mundis H. 10/02/2012, 5:04 PM

## 2012-10-02 NOTE — Progress Notes (Signed)
Patient ID: Emily Roman, female   DOB: [birth year], 31 y.o.   MRN: [id number] Patient ID: Emily Roman, female   DOB: [birth year], 31 y.o.   MRN: [id number] Hospital Day: 5  S: Preterm labor symptoms: of RLQ pain but no pain when still and was able to sleep last two evenings but when she moves she states the pain is severe.  She also states that she is scared to go home and we discussed the difficulty with her situation. We will let her process this difficult news of the poor prognosis.  We are also going to have to stop the motrin after 72 hours.  States she ate more and is ready to go home , but is concerned that she may not be able to tolerate the pain.  She has been using the bedpain and wants to make sure she can do this and she will see if more movement allows her to get through her day. If it does home in the am and she is going to try to maintain on bedrest for now.    O: Blood pressure 122/73, pulse 80, temperature 98.3 F (36.8 C), temperature source Oral, resp. rate 18, height 5\' 7"  (1.702 m), weight 67.132 kg (148 lb), last menstrual period 03/05/2012, SpO2 99.00%.   YQM:VHQIONG Toco: None EXB:MWUXLKGM: Fingertip Exam by:: Ivonne Andrew, CNM Appears less anxious and upset but was awakened from sleep Abdomen nonacute and tender where the palpable fibroid is located Stockings not on and counseled therein  Scheduled Meds:   . docusate sodium  100 mg Oral Daily  . famotidine  20 mg Oral BID  . [COMPLETED] ibuprofen  800 mg Oral Q8H  . polyethylene glycol  17 g Oral Daily  . prenatal multivitamin  1 tablet Oral Daily  . sodium chloride  3 mL Intravenous Q12H   Continuous Infusions:   . dextrose 5% lactated ringers 125 mL/hr at 09/29/12 0108  . lactated ringers 125 mL/hr at 10/01/12 0541   PRN Meds:.acetaminophen, calcium carbonate, HYDROmorphone, zolpidem    A/P- 31 y.o. admitted with: pain from fibroid  Patient Active Hospital Problem List: No active hospital  problems.   Pregnancy Complications: IUP at 15w4 Fibroid uterus H/o WPW ablated Right sided pain secondary to myoma  Shortened cervix??    Pain control Work toward outpatient management Encourage ambulation today she will use rest room    Preterm labor management: pain medication Dating:  [redacted]w[redacted]d PNL Needed:  none

## 2012-10-03 ENCOUNTER — Encounter (HOSPITAL_COMMUNITY): Payer: Self-pay | Admitting: *Deleted

## 2012-10-03 LAB — CBC
MCH: 30.9 pg (ref 26.0–34.0)
Platelets: 325 10*3/uL (ref 150–400)
RBC: 3.49 MIL/uL — ABNORMAL LOW (ref 3.87–5.11)
RDW: 11.8 % (ref 11.5–15.5)
WBC: 8.3 10*3/uL (ref 4.0–10.5)

## 2012-10-03 MED ORDER — SODIUM CHLORIDE 0.9 % IV SOLN
3.0000 g | Freq: Four times a day (QID) | INTRAVENOUS | Status: DC
  Administered 2012-10-03: 3 g via INTRAVENOUS
  Filled 2012-10-03 (×5): qty 3

## 2012-10-03 MED ORDER — METHYLERGONOVINE MALEATE 0.2 MG/ML IJ SOLN
INTRAMUSCULAR | Status: AC
  Administered 2012-10-03: 0.2 mg via INTRAMUSCULAR
  Filled 2012-10-03: qty 1

## 2012-10-03 NOTE — Discharge Summary (Signed)
Physician Discharge Summary  Patient ID: Emily Roman MRN: [id number] DOB/AGE: 1981/01/24 31 y.o.  Admit date: 09/28/2012 Discharge date: 10/03/2012  Admission Diagnoses:fibroids 15w2 and cervical insufficiency and 2nd trimester bleeding  Discharge Diagnoses:  Active Problems:  * No active hospital problems. *   Same plus ROM and thus inevitable Abortion  Discharged Condition: fair  Hospital Course: The pt admitted for control of fibroid pain at 15w2, 2nd trimester bleeding, Pregnant with cervical shortening per [place]. MFM consultation was performed and cerclage was not considered a reasonable option at this time with the clinical situation on presentation.  The pt eventually ruptured membranes on day prior to discharge and then responded to one dose of cytotec with 800 micrograms and delivered approximately 8 hours later and the placenta teased out approximately 2.5 hours later and the pt had CLE for pain control. CBC    Component Value Date/Time   WBC 8.3 10/03/2012 0850   RBC 3.49* 10/03/2012 0850   HGB 10.8* 10/03/2012 0850   HCT 31.4* 10/03/2012 0850   PLT 325 10/03/2012 0850   MCV 90.0 10/03/2012 0850   MCH 30.9 10/03/2012 0850   MCHC 34.4 10/03/2012 0850   RDW 11.8 10/03/2012 0850   LYMPHSABS 1.2 09/29/2012 1326   MONOABS 0.7 09/29/2012 1326   EOSABS 0.1 09/29/2012 1326   BASOSABS 0.0 09/29/2012 1326    cbConsults: maternal fetal medicine  Significant Diagnostic Studies: labs: see below  Treatments: induced delivery once situation became an inevitable AB  Discharge Exam: Blood pressure 112/70, pulse 67, temperature 98.6 F (37 C), temperature source Oral, resp. rate 16, height 5\' 7"  (1.702 m), weight 65.046 kg (143 lb 6.4 oz), last menstrual period 03/05/2012, SpO2 97.00%. General appearance: alert, cooperative and no distress and reportedly with minimal bleeding per rn and pt  Disposition: Final discharge disposition not confirmed to home  Discharge Orders    Future Appointments: Provider: Department: Dept Phone: Center:   10/08/2012 10:30 AM [facility] [phone] [facility]     Future Orders Please Complete By Expires   Diet - low sodium heart healthy      Discharge instructions      Comments:   Call with problems   Activity as tolerated      No wound care      Call MD for:  extreme fatigue      Call MD for:  persistant dizziness or light-headedness      Call MD for:  hives      Call MD for:  difficulty breathing, headache or visual disturbances      Call MD for:  redness, tenderness, or signs of infection (pain, swelling, redness, odor or green/yellow discharge around incision site)      Call MD for:  severe uncontrolled pain      Call MD for:  persistant nausea and vomiting      Call MD for:  temperature >100.4      Schedule appointment      Scheduling Instructions:   Please call office to schedule a f/u appt in 2 weeks       Medication List     As of 10/03/2012 10:55 AM    TAKE these medications         ibuprofen 200 MG tablet   Commonly known as: ADVIL,MOTRIN   Take 600 mg by mouth every 6 (six) hours as needed. pain      oxyCODONE-acetaminophen 5-325 MG per tablet   Commonly known  as: PERCOCET/ROXICET   Take 1 tablet by mouth every 4 (four) hours as needed. pain      prenatal multivitamin Tabs   Take 1 tablet by mouth daily.       Marland Kitchenccbc   Signed:Ledet, Yamilet Mcfayden H. 10/03/2012, 10:55 AM

## 2012-10-03 NOTE — Progress Notes (Signed)
Delivery of Stillborn. Cord clamped/cut. Plastic clamp remains on umbilical cord (maternal side)  at this time. Pt desires not to see baby. Baby taken out of room.

## 2012-10-03 NOTE — Progress Notes (Signed)
Notified Richardson Dopp of delivery. MD aware. Coming in to see pt.

## 2012-10-03 NOTE — Progress Notes (Signed)
Patient ID: Emily Roman, female   DOB: 09-May-1981, 31 y.o.   MRN: [id number] See dc summary

## 2012-10-04 NOTE — Anesthesia Postprocedure Evaluation (Signed)
Patient stable following vaginal delivery of 15 week fetus.

## 2012-10-07 ENCOUNTER — Encounter (HOSPITAL_COMMUNITY): Payer: Self-pay | Admitting: *Deleted

## 2012-10-08 ENCOUNTER — Ambulatory Visit (HOSPITAL_COMMUNITY): Admission: RE | Admit: 2012-10-08 | Payer: BC Managed Care – PPO | Source: Ambulatory Visit

## 2012-12-27 ENCOUNTER — Encounter (HOSPITAL_COMMUNITY): Payer: Self-pay | Admitting: Pharmacist

## 2013-01-02 ENCOUNTER — Encounter (HOSPITAL_COMMUNITY)
Admission: RE | Admit: 2013-01-02 | Discharge: 2013-01-02 | Disposition: A | Discharge status: Home / Self Care | Payer: BC Managed Care – PPO | Source: Ambulatory Visit | Attending: Obstetrics and Gynecology | Admitting: Obstetrics and Gynecology

## 2013-01-02 ENCOUNTER — Encounter (HOSPITAL_COMMUNITY): Payer: Self-pay | Admitting: Anesthesiology

## 2013-01-02 ENCOUNTER — Encounter (HOSPITAL_COMMUNITY): Payer: Self-pay

## 2013-01-02 LAB — CBC
HCT: 38 % (ref 36.0–46.0)
MCHC: 32.6 g/dL (ref 30.0–36.0)
MCV: 86.2 fL (ref 78.0–100.0)
RBC: 4.41 MIL/uL (ref 3.87–5.11)
RDW: 14.5 % (ref 11.5–15.5)
WBC: 4.6 10*3/uL (ref 4.0–10.5)

## 2013-01-02 LAB — SURGICAL PCR SCREEN
MRSA, PCR: NEGATIVE
Staphylococcus aureus: POSITIVE — AB

## 2013-01-02 NOTE — Anesthesia Preprocedure Evaluation (Addendum)
Anesthesia Evaluation  Patient identified by MRN, date of birth, ID band Patient awake    Reviewed: Allergy & Precautions, H&P , NPO status , Patient's Chart, lab work & pertinent test results  Airway Mallampati: II TM Distance: >3 FB     Dental no notable dental hx. (+) Teeth Intact   Pulmonary neg pulmonary ROS,  breath sounds clear to auscultation  Pulmonary exam normal       Cardiovascular + dysrhythmias Supra Ventricular Tachycardia Rhythm:Regular Rate:Normal  WPW last episode of tachycardia in 2007 prior to ablation.    Neuro/Psych negative neurological ROS  negative psych ROS   GI/Hepatic negative GI ROS, Neg liver ROS,   Endo/Other  negative endocrine ROS  Renal/GU negative Renal ROS  negative genitourinary   Musculoskeletal negative musculoskeletal ROS (+)   Abdominal   Peds  Hematology negative hematology ROS (+)   Anesthesia Other Findings   Reproductive/Obstetrics Uterine Myomas Submucous Fibroids                          Anesthesia Physical Anesthesia Plan  ASA: II  Anesthesia Plan: General   Post-op Pain Management:    Induction: Intravenous  Airway Management Planned: Oral ETT  Additional Equipment:   Intra-op Plan:   Post-operative Plan: Extubation in OR  Informed Consent: I have reviewed the patients History and Physical, chart, labs and discussed the procedure including the risks, benefits and alternatives for the proposed anesthesia with the patient or authorized representative who has indicated his/her understanding and acceptance.   Dental advisory given  Plan Discussed with: CRNA, Anesthesiologist and Surgeon  Anesthesia Plan Comments:         Anesthesia Quick Evaluation

## 2013-01-02 NOTE — Patient Instructions (Signed)
Your procedure is scheduled on:01/03/13  Enter through the Main Entrance at :0600am Pick up desk phone and dial [phone] and inform us of your arrival.  Please call [phone] if you have any problems the morning of surgery.  Remember: Do not eat or drink after midnight:Thursday   Take these meds the morning of surgery with a sip of water:none  DO NOT wear jewelry, eye make-up, lipstick,body lotion, or dark fingernail polish. Do not shave for 48 hours prior to surgery.  If you are to be admitted after surgery, leave suitcase in car until your room has been assigned. Patients discharged on the day of surgery will not be allowed to drive home.

## 2013-01-03 ENCOUNTER — Encounter (HOSPITAL_COMMUNITY): Payer: Self-pay

## 2013-01-03 ENCOUNTER — Encounter: Payer: Self-pay | Admitting: Obstetrics and Gynecology

## 2013-01-03 ENCOUNTER — Encounter (HOSPITAL_COMMUNITY)
Admission: RE | Disposition: A | Discharge status: Home / Self Care | Payer: Self-pay | Source: Ambulatory Visit | Attending: Obstetrics and Gynecology

## 2013-01-03 ENCOUNTER — Ambulatory Visit (HOSPITAL_COMMUNITY)
Admission: RE | Admit: 2013-01-03 | Discharge: 2013-01-03 | Disposition: A | Discharge status: Home / Self Care | Payer: BC Managed Care – PPO | Source: Ambulatory Visit | Attending: Obstetrics and Gynecology | Admitting: Obstetrics and Gynecology

## 2013-01-03 ENCOUNTER — Encounter (HOSPITAL_COMMUNITY): Payer: Self-pay | Admitting: General Practice

## 2013-01-03 ENCOUNTER — Ambulatory Visit (HOSPITAL_COMMUNITY): Payer: BC Managed Care – PPO

## 2013-01-03 DIAGNOSIS — N920 Excessive and frequent menstruation with regular cycle: Secondary | ICD-10-CM | POA: Insufficient documentation

## 2013-01-03 DIAGNOSIS — IMO0002 Reserved for concepts with insufficient information to code with codable children: Secondary | ICD-10-CM | POA: Insufficient documentation

## 2013-01-03 DIAGNOSIS — N949 Unspecified condition associated with female genital organs and menstrual cycle: Secondary | ICD-10-CM | POA: Insufficient documentation

## 2013-01-03 DIAGNOSIS — D252 Subserosal leiomyoma of uterus: Secondary | ICD-10-CM | POA: Insufficient documentation

## 2013-01-03 DIAGNOSIS — D251 Intramural leiomyoma of uterus: Secondary | ICD-10-CM | POA: Insufficient documentation

## 2013-01-03 DIAGNOSIS — Y9210 Unspecified residential institution as the place of occurrence of the external cause: Secondary | ICD-10-CM | POA: Insufficient documentation

## 2013-01-03 HISTORY — PX: LAPAROSCOPY: SHX197

## 2013-01-03 HISTORY — PX: LAPAROSCOPIC LYSIS OF ADHESIONS: SHX5905

## 2013-01-03 LAB — PREPARE RBC (CROSSMATCH)

## 2013-01-03 SURGERY — LAPAROSCOPY OPERATIVE
Anesthesia: General | Site: Abdomen | Wound class: Clean Contaminated

## 2013-01-03 MED ORDER — FENTANYL CITRATE 0.05 MG/ML IJ SOLN
INTRAMUSCULAR | Status: AC
  Administered 2013-01-03: 50 ug via INTRAVENOUS
  Filled 2013-01-03: qty 2

## 2013-01-03 MED ORDER — FENTANYL CITRATE 0.05 MG/ML IJ SOLN
INTRAMUSCULAR | Status: AC
  Filled 2013-01-03: qty 2

## 2013-01-03 MED ORDER — MIDAZOLAM HCL 5 MG/5ML IJ SOLN
INTRAMUSCULAR | Status: DC | PRN
  Administered 2013-01-03: 2 mg via INTRAVENOUS

## 2013-01-03 MED ORDER — PROPOFOL 10 MG/ML IV EMUL
INTRAVENOUS | Status: DC | PRN
  Administered 2013-01-03: 150 mg via INTRAVENOUS

## 2013-01-03 MED ORDER — VASOPRESSIN 20 UNIT/ML IJ SOLN
INTRAMUSCULAR | Status: DC | PRN
  Administered 2013-01-03: 20 [IU]

## 2013-01-03 MED ORDER — NEOSTIGMINE METHYLSULFATE 1 MG/ML IJ SOLN
INTRAMUSCULAR | Status: DC | PRN
  Administered 2013-01-03: 2 mg via INTRAVENOUS

## 2013-01-03 MED ORDER — CEFAZOLIN SODIUM-DEXTROSE 2-3 GM-% IV SOLR
2.0000 g | INTRAVENOUS | Status: AC
  Administered 2013-01-03: 2 g via INTRAVENOUS

## 2013-01-03 MED ORDER — KETOROLAC TROMETHAMINE 30 MG/ML IJ SOLN
INTRAMUSCULAR | Status: AC
  Filled 2013-01-03: qty 2

## 2013-01-03 MED ORDER — FENTANYL CITRATE 0.05 MG/ML IJ SOLN
INTRAMUSCULAR | Status: AC
  Filled 2013-01-03: qty 5

## 2013-01-03 MED ORDER — ROCURONIUM BROMIDE 100 MG/10ML IV SOLN
INTRAVENOUS | Status: DC | PRN
  Administered 2013-01-03: 10 mg via INTRAVENOUS
  Administered 2013-01-03: 40 mg via INTRAVENOUS

## 2013-01-03 MED ORDER — BUPIVACAINE-EPINEPHRINE PF 0.25-1:200000 % IJ SOLN
INTRAMUSCULAR | Status: AC
  Filled 2013-01-03: qty 30

## 2013-01-03 MED ORDER — ONDANSETRON HCL 4 MG/2ML IJ SOLN
INTRAMUSCULAR | Status: DC | PRN
  Administered 2013-01-03: 4 mg via INTRAVENOUS

## 2013-01-03 MED ORDER — LACTATED RINGERS IR SOLN
Status: DC | PRN
  Administered 2013-01-03: 3000 mL

## 2013-01-03 MED ORDER — GLYCOPYRROLATE 0.2 MG/ML IJ SOLN
INTRAMUSCULAR | Status: DC | PRN
  Administered 2013-01-03: 0.1 mg via INTRAVENOUS
  Administered 2013-01-03: 0.4 mg via INTRAVENOUS

## 2013-01-03 MED ORDER — SODIUM CHLORIDE 0.9 % IJ SOLN
INTRAMUSCULAR | Status: DC | PRN
  Administered 2013-01-03: 100 mL

## 2013-01-03 MED ORDER — CEFAZOLIN SODIUM-DEXTROSE 2-3 GM-% IV SOLR
INTRAVENOUS | Status: AC
  Filled 2013-01-03: qty 50

## 2013-01-03 MED ORDER — SCOPOLAMINE 1 MG/3DAYS TD PT72
1.0000 | MEDICATED_PATCH | TRANSDERMAL | Status: DC
  Administered 2013-01-03: 1.5 mg via TRANSDERMAL

## 2013-01-03 MED ORDER — VASOPRESSIN 20 UNIT/ML IJ SOLN
INTRAMUSCULAR | Status: AC
  Filled 2013-01-03: qty 1

## 2013-01-03 MED ORDER — NEOSTIGMINE METHYLSULFATE 1 MG/ML IJ SOLN
INTRAMUSCULAR | Status: AC
  Filled 2013-01-03: qty 1

## 2013-01-03 MED ORDER — LACTATED RINGERS IV SOLN
INTRAVENOUS | Status: DC
  Administered 2013-01-03 (×5): via INTRAVENOUS

## 2013-01-03 MED ORDER — FENTANYL CITRATE 0.05 MG/ML IJ SOLN
25.0000 ug | INTRAMUSCULAR | Status: DC | PRN
  Administered 2013-01-03: 50 ug via INTRAVENOUS

## 2013-01-03 MED ORDER — BUPIVACAINE-EPINEPHRINE 0.25% -1:200000 IJ SOLN
INTRAMUSCULAR | Status: DC | PRN
  Administered 2013-01-03: 14 mL

## 2013-01-03 MED ORDER — SCOPOLAMINE 1 MG/3DAYS TD PT72
MEDICATED_PATCH | TRANSDERMAL | Status: AC
  Administered 2013-01-03: 1.5 mg via TRANSDERMAL
  Filled 2013-01-03: qty 1

## 2013-01-03 MED ORDER — METOCLOPRAMIDE HCL 5 MG/ML IJ SOLN: 10.0000 mg | Freq: Once | INTRAMUSCULAR | Status: DC | PRN

## 2013-01-03 MED ORDER — SODIUM CHLORIDE 0.9 % IJ SOLN
INTRAMUSCULAR | Status: DC | PRN
  Administered 2013-01-03: 50 mL

## 2013-01-03 MED ORDER — GLYCOPYRROLATE 0.2 MG/ML IJ SOLN
INTRAMUSCULAR | Status: AC
  Filled 2013-01-03: qty 2

## 2013-01-03 MED ORDER — INDIGOTINDISULFONATE SODIUM 8 MG/ML IJ SOLN
INTRAMUSCULAR | Status: DC | PRN
  Administered 2013-01-03: 5 mL

## 2013-01-03 MED ORDER — DEXAMETHASONE SODIUM PHOSPHATE 10 MG/ML IJ SOLN
INTRAMUSCULAR | Status: AC
  Filled 2013-01-03: qty 1

## 2013-01-03 MED ORDER — LIDOCAINE HCL (CARDIAC) 20 MG/ML IV SOLN
INTRAVENOUS | Status: DC | PRN
  Administered 2013-01-03: 80 mg via INTRAVENOUS

## 2013-01-03 MED ORDER — PROPOFOL 10 MG/ML IV EMUL
INTRAVENOUS | Status: AC
  Filled 2013-01-03: qty 20

## 2013-01-03 MED ORDER — KETOROLAC TROMETHAMINE 30 MG/ML IJ SOLN
INTRAMUSCULAR | Status: DC | PRN
  Administered 2013-01-03: 60 mg via INTRAVENOUS

## 2013-01-03 MED ORDER — ONDANSETRON HCL 4 MG/2ML IJ SOLN
INTRAMUSCULAR | Status: AC
  Filled 2013-01-03: qty 2

## 2013-01-03 MED ORDER — MIDAZOLAM HCL 2 MG/2ML IJ SOLN
INTRAMUSCULAR | Status: AC
  Filled 2013-01-03: qty 2

## 2013-01-03 MED ORDER — GLYCOPYRROLATE 0.2 MG/ML IJ SOLN
INTRAMUSCULAR | Status: AC
  Filled 2013-01-03: qty 1

## 2013-01-03 MED ORDER — FENTANYL CITRATE 0.05 MG/ML IJ SOLN
INTRAMUSCULAR | Status: DC | PRN
  Administered 2013-01-03 (×5): 50 ug via INTRAVENOUS
  Administered 2013-01-03: 100 ug via INTRAVENOUS
  Administered 2013-01-03 (×5): 50 ug via INTRAVENOUS

## 2013-01-03 MED ORDER — LIDOCAINE HCL (CARDIAC) 20 MG/ML IV SOLN
INTRAVENOUS | Status: AC
  Filled 2013-01-03: qty 5

## 2013-01-03 MED ORDER — ROCURONIUM BROMIDE 50 MG/5ML IV SOLN
INTRAVENOUS | Status: AC
  Filled 2013-01-03: qty 1

## 2013-01-03 MED ORDER — DEXAMETHASONE SODIUM PHOSPHATE 4 MG/ML IJ SOLN
INTRAMUSCULAR | Status: DC | PRN
  Administered 2013-01-03: 10 mg via INTRAVENOUS

## 2013-01-03 MED ORDER — HYDROCODONE-ACETAMINOPHEN 5-500 MG PO TABS: 1.0000 | ORAL_TABLET | ORAL | Status: DC | PRN

## 2013-01-03 MED ORDER — INDIGOTINDISULFONATE SODIUM 8 MG/ML IJ SOLN
INTRAMUSCULAR | Status: AC
  Filled 2013-01-03: qty 5

## 2013-01-03 SURGICAL SUPPLY — 58 items
CABLE HIGH FREQUENCY MONO STRZ (ELECTRODE) ×2 IMPLANT
CATH ROBINSON RED A/P 16FR (CATHETERS) ×2 IMPLANT
CHLORAPREP W/TINT 26ML (MISCELLANEOUS) ×2 IMPLANT
CLOTH BEACON ORANGE TIMEOUT ST (SAFETY) ×2 IMPLANT
DECANTER SPIKE VIAL GLASS SM (MISCELLANEOUS) ×10 IMPLANT
DERMABOND ADVANCED (GAUZE/BANDAGES/DRESSINGS) ×1
DERMABOND ADVANCED .7 DNX12 (GAUZE/BANDAGES/DRESSINGS) ×1 IMPLANT
DRSG COVADERM PLUS 2X2 (GAUZE/BANDAGES/DRESSINGS) ×2 IMPLANT
ELECT NEEDLE TIP 2.8 STRL (NEEDLE) ×2 IMPLANT
ELECT REM PT RETURN 9FT ADLT (ELECTROSURGICAL) ×2
ELECTRODE REM PT RTRN 9FT ADLT (ELECTROSURGICAL) ×1 IMPLANT
FILTER SMOKE EVAC LAPAROSHD (FILTER) ×4 IMPLANT
GLOVE BIO SURGEON STRL SZ8 (GLOVE) ×8 IMPLANT
GLOVE BIOGEL PI IND STRL 8.5 (GLOVE) ×2 IMPLANT
GLOVE BIOGEL PI INDICATOR 8.5 (GLOVE) ×2
GLOVE SURG SS PI 7.0 STRL IVOR (GLOVE) ×8 IMPLANT
GOWN STRL REIN XL XLG (GOWN DISPOSABLE) ×10 IMPLANT
MANIPULATOR UTERINE 4.5 ZUMI (MISCELLANEOUS) ×2 IMPLANT
NEEDLE HYPO 18GX1.5 BLUNT FILL (NEEDLE) ×2 IMPLANT
NEEDLE INSUFFLATION 120MM (ENDOMECHANICALS) ×2 IMPLANT
NEEDLE SPNL 22GX3.5 QUINCKE BK (NEEDLE) ×2 IMPLANT
PACK LAPAROSCOPY BASIN (CUSTOM PROCEDURE TRAY) ×2 IMPLANT
PAD OB MATERNITY 4.3X12.25 (PERSONAL CARE ITEMS) ×2 IMPLANT
PAD PREP 24X48 CUFFED NSTRL (MISCELLANEOUS) ×2 IMPLANT
PENCIL BUTTON HOLSTER BLD 10FT (ELECTRODE) ×2 IMPLANT
POUCH SPECIMEN RETRIEVAL 10MM (ENDOMECHANICALS) IMPLANT
PROTECTOR NERVE ULNAR (MISCELLANEOUS) ×4 IMPLANT
SCALPEL HARMONIC ACE (MISCELLANEOUS) ×2 IMPLANT
SEPRAFILM MEMBRANE 5X6 (MISCELLANEOUS) ×4 IMPLANT
SET IRRIG TUBING LAPAROSCOPIC (IRRIGATION / IRRIGATOR) ×2 IMPLANT
SPONGE GAUZE 2X2 8PLY STRL LF (GAUZE/BANDAGES/DRESSINGS) ×2 IMPLANT
SPONGE LAP 4X18 X RAY DECT (DISPOSABLE) ×2 IMPLANT
SUT MONOCRYL 3-0 PS-2 UND MONO (SUTURE) ×2 IMPLANT
SUT PROLENE 0 CT 1 30 (SUTURE) ×2 IMPLANT
SUT SILK 3 0 SH 30 (SUTURE) ×2 IMPLANT
SUT VIC AB 2-0 CT1 (SUTURE) ×6 IMPLANT
SUT VIC AB 2-0 UR6 27 (SUTURE) ×2 IMPLANT
SUT VIC AB 3-0 SH 27 (SUTURE) ×1
SUT VIC AB 3-0 SH 27XBRD (SUTURE) ×1 IMPLANT
SUT VIC AB 4-0 SH 27 (SUTURE) ×2
SUT VIC AB 4-0 SH 27XANBCTRL (SUTURE) ×2 IMPLANT
SUT VICRYL 0 TIES 12 18 (SUTURE) IMPLANT
SYR 20CC LL (SYRINGE) ×2 IMPLANT
SYR 50ML LL SCALE MARK (SYRINGE) ×8 IMPLANT
SYR 5ML LL (SYRINGE) ×2 IMPLANT
SYR CONTROL 10ML LL (SYRINGE) ×4 IMPLANT
SYR TOOMEY 50ML (SYRINGE) ×2 IMPLANT
SYS LAPSCP GELPORT 120MM (MISCELLANEOUS) ×2
SYSTEM LAPSCP GELPORT 120MM (MISCELLANEOUS) ×1 IMPLANT
TOWEL OR 17X24 6PK STRL BLUE (TOWEL DISPOSABLE) ×6 IMPLANT
TRAY FOLEY CATH 14FR (SET/KITS/TRAYS/PACK) ×2 IMPLANT
TROCAR OPTI TIP 5M 100M (ENDOMECHANICALS) ×4 IMPLANT
TROCAR XCEL DIL TIP R 11M (ENDOMECHANICALS) ×2 IMPLANT
TROCAR Z-THREAD FIOS 5X100MM (TROCAR) ×4 IMPLANT
TUBING SUCTION BULK 100 FT (MISCELLANEOUS) ×2 IMPLANT
WARMER LAPAROSCOPE (MISCELLANEOUS) ×2 IMPLANT
WATER STERILE IRR 1000ML POUR (IV SOLUTION) ×2 IMPLANT
YANKAUER SUCT BULB TIP NO VENT (SUCTIONS) ×2 IMPLANT

## 2013-01-03 NOTE — Op Note (Signed)
Preoperative diagnosis:  Uterine myomas, pelvic pain and menorrhagia  Postoperative diagnosis: Large degenerating uterine myomas, subserosal and transmural, pelvic pain and menorrhagia  Procedure: Laparoscopy, Gelport-assisted myomectomy, enterolysis, LOA, repair of large colon serosa Anesthesia: Gen. endotracheal  Surgeon: Fermin Schwab  Assistant: - Complications: None  Estimated blood loss: 200 ml Specimens: Myoma specimens Findings: On exam under anesthesia, uterus was enlarged to 18-20 gestational weeks size, irregular firm and had a ballotable fundal mass component. No posterior fornix induration was palpated.  On laparoscopy, upper abdomen, liver edge and diaphragm surfaces were within normal limits. The uterus was enlarged with a 10-12 cm pedunculated right anterior myoma showing carneous and cystic degeneration filling up the right iliac fossa and extending to right mid abdomen. Two thirds of this mass was encased in filmy and dense omental adhesions and adhesions to iliac fossa. Moreover the cecum was also adherent to the superior aspect of the mass. The appendix was within normal limits. In addition there was a 4 cm anterior transmural degenerating myoma and a 5 cm posterior intramural myoma with cystic degeneration. Both tubes and ovaries appeared normal. There was no evidence of lesions in the posterior cul-de-sac. Description of the procedure: Patient was placed in lithotomy position and general endotracheal anesthesia was given. 2 g of cefazolin were  given intravenously for prophylaxis. She was prepped and draped in sterile manner. A Foley catheter was inserted into the bladder appeared . A ZUMI uterine manipulator was inserted into the uterus for uterine manipulation and chromotubation. The uterus sounded to 8 cm.   An operative field was created on the abdomen and the surgeon was regloved. After preemptive anesthesia with quarter percent bupivacaine and 1:200,000 epinephrine, a  supraumbilical (8 cm above) skin incision was made. A Verress needle was inserted and pneumoperitoneum was created with carbon dioxide. A 12 mm trocar was placed at this incision. A 5-mm 30 degree laparoscope was inserted and, under direct visualization, 2 5-mm incisions were made in paraumbilical lateral areas and corresponding  trochars were placed. A 3-cm skin incision was made suprapubically, and extended to fascia, fascia was nicked and incised in transverse elliptical manner and the rectus muscles were separated at midline, the peritoneum was picked and incised in vertical manner and the abdomen was entered . A GelPort was placed through this small incision. 30-40 minutes were spent to light the omental and intestinal adhesions to the pedunculated myoma and care was taken not to injure the bowel wall. This lysis of adhesions was necessary in order to complete the rest of the procedure. Finally the large mass was completely freed up except for its pedicle with the fundus of the uterus.  After opening the GelPort the pedicle was clamped and the fibroid was severed from its attachment and the resulting defect on the fundus was closed with 2-0 Vicryl transfixion sutures. The large myoma was morcellated in the abdomen and taken out through the GelPort. A dilute vasopressin solution (0.4 units per milliliter) was injected transcutaneously into the myometrium of the uterus. Using electrosurgery, incisions were made  on both the anterior and the posterior  myomas, and myomas were dissected using blunt and  sharp dissection, part of the dissection was carried out manually after opening the GelPort. Hemostasis was insured. The myoma defects were closed 3 layers the first and second layers were a 2-0 Vicryl continuous suture and the third layer was a  4-0 Vicryl  continuous suture.   a 2-1/2 cm muscularis defect was noted on the cecum resulting  from the enteroclysis and it was repaired with 3-0 Vicryl continuous  suture and 3-0 silk continuous suture. The bowel mucosa remained intact.  Pelvis was copiously irrigated with lactated Ringer solution and a slurry of Seprafilm (2 sheets in 40 mL of lactated Ringer solution) was instilled into the pelvis as an adhesion barrier.  The suprapubic 3 cm incision was closed by placing 2-0 Vicryl on the fascia and 4-0 Monocryl subcuticular sutures on the skin. The skin incisions were approximated with Dermabond.   At this point the procedure was terminated hemostasis was insured. Instrument and lap pad count was correct.   The patient tolerated the procedure well and was transferred to recovery room in satisfactory condition.  Special Note: Due to to significant myometrial incisions, the patient is recommended to have cesarean delivery with future pregnancies

## 2013-01-03 NOTE — Anesthesia Postprocedure Evaluation (Signed)
  Anesthesia Post-op Note  Anesthesia Post Note  Patient: Emily Roman  Procedure(s) Performed: Procedure(s) (LRB): LAPAROSCOPY OPERATIVE (N/A) LAPAROSCOPIC LYSIS OF ADHESIONS (N/A)  Anesthesia type: General  Patient location: PACU  Post pain: Pain level controlled  Post assessment: Post-op Vital signs reviewed  Last Vitals:  Filed Vitals:   01/03/13 1330  BP: 116/68  Pulse: 102  Temp: 36.9 C  Resp: 14    Post vital signs: Reviewed  Level of consciousness: sedated  Complications: No apparent anesthesia complications

## 2013-01-03 NOTE — Preoperative (Signed)
Beta Blockers   Reason not to administer Beta Blockers:Not Applicable 

## 2013-01-03 NOTE — OR Nursing (Signed)
Dr. April Manson called back to OR room 4 to assess bloody vaginal ooze.  Dr. April Manson back to OR room 4 to assess and states finding is normal.

## 2013-01-03 NOTE — H&P (Addendum)
Emily Roman is a 32 y.o. female , originally referred to me by Dr. Christell Constant, for L./S Gelport assisted myomectomy.  She was diagnosed with fibroids because of abnormal uterine bleeding in 2013.  She had a second trim loss due to cx insufficiency.   She has been having monthly periods but with heavy flow and prolonged duration.  Patient would like to preserve her childbearing potential.  Pertinent Gynecological History: Menses: flow is excessive with use of 3 pads or tampons on heaviest days Bleeding: dysfunctional uterine bleeding Contraception: none DES exposure: denies Blood transfusions: none Sexually transmitted diseases: no past history Previous GYN Procedures: SAB Last mammogram: normal Last pap: normal  OB History: SAB at 5 mo   Menstrual History: Menarche age: 52 No LMP recorded.    Past Medical History  Diagnosis Date  . Wolff-Parkinson-White (WPW) syndrome 2007    ablation done- no further probs                    Past Surgical History  Procedure Laterality Date  . Eye surgery    . Ablation  2007    cardiac ablation             Family history: No hereditary disease.  No cancer of breast, ovary, uterus. No cutaneous leiomyomatosis or renal cell carcinoma.  Social History:  reports that she has never smoked. She does not have any smokeless tobacco history on file. She reports that she does not drink alcohol or use illicit drugs.  Allergies: No Known Allergies  No prescriptions prior to admission    Review of Systems  Constitutional: Negative.   HENT: Negative.   Eyes: Negative.   Respiratory: Negative.   Cardiovascular: cardiac ablation   Gastrointestinal: Negative.   Genitourinary: Negative.   Musculoskeletal: Negative.   Skin: Negative.   Neurological: Negative.   Endo/Heme/Allergies: Negative.   Psychiatric/Behavioral: Negative.     BP 118/76  Pulse 84  Temp(Src) 98.1 F (36.7 C) (Oral)  Resp 16  SpO2 100%  Physical Exam   Constitutional: She is oriented to person, place, and time. She appears well-developed and well-nourished.  HENT:  Head: Normocephalic and atraumatic.  Nose: Nose normal.  Mouth/Throat: Oropharynx is clear and moist. No oropharyngeal exudate.  Eyes: Conjunctivae normal and EOM are normal. Pupils are equal, round, and reactive to light. No scleral icterus.  Neck: Normal range of motion. Neck supple. No tracheal deviation present. No thyromegaly present.  Cardiovascular: Normal rate.   Respiratory: Effort normal and breath sounds normal.  GI: Soft. Bowel sounds are normal. She exhibits no distension and no mass. There is no tenderness.  Abdomen 20 wk size firm irregular pelvic mass, ballotable, nontender Pelvic: deferred Lymphadenopathy:    She has no cervical adenopathy.  Neurological: She is alert and oriented to person, place, and time. She has normal reflexes.  Skin: Skin is warm.  Psychiatric: She has a normal mood and affect. Her behavior is normal. Judgment and thought content normal.       Assessment/Plan: Intramural and subserosal uterine myomas, causing menorrhagia and pressure sensation. Records and [place] reports from Dr [facility] office were reviewed. Preoperative for L/S Gelport assisted myomectomy Benefits and risks of myomectomy were discussed with the patient and her family member again.    All of patient's questions were answered.  She verbalized understanding.  She knows that she will need a cesarean delivery for future pregnancies, and that it is recommended she does not conceive for 2-3 months for uterus  to heal.

## 2013-01-03 NOTE — Transfer of Care (Signed)
Immediate Anesthesia Transfer of Care Note  Patient: Emily Roman  Procedure(s) Performed: Procedure(s) with comments: LAPAROSCOPY OPERATIVE (N/A) - gel port assist myomyectomy  Patient Location: PACU  Anesthesia Type:General  Level of Consciousness: awake, alert , oriented and patient cooperative  Airway & Oxygen Therapy: Patient Spontanous Breathing and Patient connected to nasal cannula oxygen  Post-op Assessment: Report given to PACU RN and Post -op Vital signs reviewed and stable  Post vital signs: Reviewed and stable  Complications: No apparent anesthesia complications

## 2013-01-03 NOTE — OR Nursing (Signed)
Pt's legs, arms and hands checked for proper placement throughout the procedure at 0835,  0915 and 1011.

## 2013-01-03 NOTE — OR Nursing (Signed)
Checking placement of legs, arms and hands at 0835, 0915 and 1011 are properly placed and positioned.

## 2013-01-06 ENCOUNTER — Encounter (HOSPITAL_COMMUNITY): Payer: Self-pay | Admitting: Obstetrics and Gynecology

## 2013-01-07 LAB — TYPE AND SCREEN
Unit division: 0
Unit division: 0
Unit division: 0

## 2013-05-26 LAB — OB RESULTS CONSOLE RUBELLA ANTIBODY, IGM: Rubella: IMMUNE

## 2013-05-26 LAB — OB RESULTS CONSOLE HEPATITIS B SURFACE ANTIGEN: Hepatitis B Surface Ag: NEGATIVE

## 2013-06-06 ENCOUNTER — Encounter (HOSPITAL_COMMUNITY): Payer: Self-pay | Admitting: Obstetrics and Gynecology

## 2013-06-06 ENCOUNTER — Inpatient Hospital Stay (HOSPITAL_COMMUNITY)
Admission: AD | Admit: 2013-06-06 | Discharge: 2013-06-06 | Disposition: A | Discharge status: Home / Self Care | Payer: BC Managed Care – PPO | Source: Ambulatory Visit | Attending: Obstetrics and Gynecology | Admitting: Obstetrics and Gynecology

## 2013-06-06 DIAGNOSIS — K589 Irritable bowel syndrome without diarrhea: Secondary | ICD-10-CM | POA: Insufficient documentation

## 2013-06-06 DIAGNOSIS — O99891 Other specified diseases and conditions complicating pregnancy: Secondary | ICD-10-CM | POA: Insufficient documentation

## 2013-06-06 DIAGNOSIS — K5289 Other specified noninfective gastroenteritis and colitis: Secondary | ICD-10-CM | POA: Insufficient documentation

## 2013-06-06 DIAGNOSIS — R197 Diarrhea, unspecified: Secondary | ICD-10-CM | POA: Insufficient documentation

## 2013-06-06 DIAGNOSIS — J069 Acute upper respiratory infection, unspecified: Secondary | ICD-10-CM | POA: Insufficient documentation

## 2013-06-06 LAB — URINE MICROSCOPIC-ADD ON

## 2013-06-06 LAB — CBC WITH DIFFERENTIAL/PLATELET
HCT: 35.1 % — ABNORMAL LOW (ref 36.0–46.0)
Hemoglobin: 12.4 g/dL (ref 12.0–15.0)
Lymphs Abs: 2.7 10*3/uL (ref 0.7–4.0)
Monocytes Absolute: 0.5 10*3/uL (ref 0.1–1.0)
Monocytes Relative: 7 % (ref 3–12)
Neutro Abs: 3.7 10*3/uL (ref 1.7–7.7)
Neutrophils Relative %: 53 % (ref 43–77)
RBC: 3.95 MIL/uL (ref 3.87–5.11)

## 2013-06-06 LAB — COMPREHENSIVE METABOLIC PANEL
Alkaline Phosphatase: 54 U/L (ref 39–117)
BUN: 12 mg/dL (ref 6–23)
CO2: 23 mEq/L (ref 19–32)
Chloride: 101 mEq/L (ref 96–112)
GFR calc Af Amer: >90 mL/min (ref >90)
GFR calc non Af Amer: >90 mL/min (ref >90)
Glucose, Bld: 98 mg/dL (ref 70–99)
Potassium: 3.4 mEq/L — ABNORMAL LOW (ref 3.5–5.1)
Total Bilirubin: 0.4 mg/dL (ref 0.3–1.2)

## 2013-06-06 LAB — URINALYSIS, ROUTINE W REFLEX MICROSCOPIC
Bilirubin Urine: NEGATIVE
Glucose, UA: NEGATIVE mg/dL
Nitrite: NEGATIVE
Specific Gravity, Urine: 1.025 (ref 1.005–1.030)
pH: 7 (ref 5.0–8.0)

## 2013-06-06 MED ORDER — PSEUDOEPHEDRINE HCL 30 MG PO TABS: 30.0000 mg | ORAL_TABLET | ORAL | Status: DC | PRN

## 2013-06-06 MED ORDER — GUAIFENESIN ER 600 MG PO TB12: 1200.0000 mg | ORAL_TABLET | Freq: Two times a day (BID) | ORAL | Status: DC

## 2013-06-06 NOTE — MAU Note (Signed)
Emily Roman was sent from her Dr. Isidore Moos to be evaluated. She is [redacted]w[redacted]d, complaints of URI and 2 episodes of diarrhea today and 2 episodes of diarrhea yesterday. Denies fever, vaginal bleeding, or LOF.

## 2013-06-06 NOTE — MAU Provider Note (Signed)
History     CSN: [id number]  Arrival date and time: 06/06/13 1800   None     Chief Complaint  Patient presents with  . Diarrhea  . URI   HPI This is a 32 y.o. female at [redacted]w[redacted]d who presents with report of diarrhea (2 loose stools per day), nasal congestion, and feeling like she has a fever ("because of that taste in my mouth"). Did not take temperature. Ate peaches from [employer] and thinks it may be listeria (has been eating them for 2 weeks).   RN Note: Ms. Hegg was sent from her Dr. Isidore Moos to be evaluated. She is [redacted]w[redacted]d, complaints of URI and 2 episodes of diarrhea today and 2 episodes of diarrhea yesterday. Denies fever, vaginal bleeding, or LOF.       OB History   Grav Para Term Preterm Abortions TAB SAB Ect Mult Living   2 1 0 0 0 0 0 0 0 0       Past Medical History  Diagnosis Date  . Wolff-Parkinson-White (WPW) syndrome 2007    ablation done- no further probs    Past Surgical History  Procedure Laterality Date  . Eye surgery    . Ablation  2007    cardiac ablation  . Laparoscopy N/A 01/03/2013    Procedure: LAPAROSCOPY OPERATIVE;  Surgeon: Fermin Schwab, MD;  Location: [facility];  Service: Gynecology;  Laterality: N/A;  gel port assist myomyectomy. Enterolysis and repair of cecum Serosa.    . Laparoscopic lysis of adhesions N/A 01/03/2013    Procedure: LAPAROSCOPIC LYSIS OF ADHESIONS;  Surgeon: Fermin Schwab, MD;  Location: [facility];  Service: Gynecology;  Laterality: N/A;    No family history on file.  History  Substance Use Topics  . Smoking status: Never Smoker   . Smokeless tobacco: Not on file  . Alcohol Use: No    Allergies:  Allergies  Allergen Reactions  . Shellfish Allergy Anaphylaxis    Prescriptions prior to admission  Medication Sig Dispense Refill  . Prenatal Vit-Fe Fumarate-FA (PRENATAL MULTIVITAMIN) TABS Take 1 tablet by mouth daily.        Review of Systems  Constitutional: Positive for chills (feels hot then cold). Negative for  fever.  HENT: Positive for congestion. Negative for sore throat (scratchy).   Eyes: Negative for blurred vision.  Cardiovascular: Negative for chest pain.  Gastrointestinal: Positive for diarrhea (2 stools yesterday, 2 today). Negative for nausea, vomiting, abdominal pain and constipation.  Genitourinary: Negative for dysuria.  Neurological: Negative for dizziness.   Physical Exam   Blood pressure 130/70, pulse 92, temperature 98.4 F (36.9 C), temperature source Oral, resp. rate 18, last menstrual period 03/25/2013.  Physical Exam  Constitutional: She is oriented to person, place, and time. She appears well-developed. No distress.  HENT:  Head: Normocephalic.  Mouth/Throat: Oropharyngeal exudate (clear nasal discharge) present.  Cardiovascular: Normal rate and regular rhythm.   Respiratory: Effort normal and breath sounds normal.  GI: Soft. She exhibits no distension. There is no tenderness. There is no rebound and no guarding.  Musculoskeletal: Normal range of motion.  Neurological: She is alert and oriented to person, place, and time.  Skin: Skin is warm and dry.  Psychiatric: She has a normal mood and affect.    MAU Course  Procedures  MDM Results for orders placed during the hospital encounter of 06/06/13 (from the past 24 hour(s))  URINALYSIS, ROUTINE W REFLEX MICROSCOPIC     Status: Abnormal   Collection Time    06/06/13  6:00 PM      Result Value Range   Color, Urine YELLOW  YELLOW   APPearance CLEAR  CLEAR   Specific Gravity, Urine 1.025  1.005 - 1.030   pH 7.0  5.0 - 8.0   Glucose, UA NEGATIVE  NEGATIVE mg/dL   Hgb urine dipstick NEGATIVE  NEGATIVE   Bilirubin Urine NEGATIVE  NEGATIVE   Ketones, ur NEGATIVE  NEGATIVE mg/dL   Protein, ur NEGATIVE  NEGATIVE mg/dL   Urobilinogen, UA 0.2  0.0 - 1.0 mg/dL   Nitrite NEGATIVE  NEGATIVE   Leukocytes, UA SMALL (*) NEGATIVE  URINE MICROSCOPIC-ADD ON     Status: Abnormal   Collection Time    06/06/13  6:00 PM       Result Value Range   Squamous Epithelial / LPF FEW (*) RARE   WBC, UA 0-2  <3 WBC/hpf   RBC / HPF 0-2  <3 RBC/hpf   Bacteria, UA MANY (*) RARE  CBC WITH DIFFERENTIAL     Status: Abnormal   Collection Time    06/06/13  7:04 PM      Result Value Range   WBC 7.0  4.0 - 10.5 K/uL   RBC 3.95  3.87 - 5.11 MIL/uL   Hemoglobin 12.4  12.0 - 15.0 g/dL   HCT 16.1 (*) 09.6 - 04.5 %   MCV 88.9  78.0 - 100.0 fL   MCH 31.4  26.0 - 34.0 pg   MCHC 35.3  30.0 - 36.0 g/dL   RDW 40.9  81.1 - 91.4 %   Platelets 239  150 - 400 K/uL   Neutrophils Relative % 53  43 - 77 %   Neutro Abs 3.7  1.7 - 7.7 K/uL   Lymphocytes Relative 38  12 - 46 %   Lymphs Abs 2.7  0.7 - 4.0 K/uL   Monocytes Relative 7  3 - 12 %   Monocytes Absolute 0.5  0.1 - 1.0 K/uL   Eosinophils Relative 2  0 - 5 %   Eosinophils Absolute 0.2  0.0 - 0.7 K/uL   Basophils Relative 0  0 - 1 %   Basophils Absolute 0.0  0.0 - 0.1 K/uL  COMPREHENSIVE METABOLIC PANEL     Status: Abnormal   Collection Time    06/06/13  7:04 PM      Result Value Range   Sodium 134 (*) 135 - 145 mEq/L   Potassium 3.4 (*) 3.5 - 5.1 mEq/L   Chloride 101  96 - 112 mEq/L   CO2 23  19 - 32 mEq/L   Glucose, Bld 98  70 - 99 mg/dL   BUN 12  6 - 23 mg/dL   Creatinine, Ser 7.82  0.50 - 1.10 mg/dL   Calcium 9.3  8.4 - 95.6 mg/dL   Total Protein 6.7  6.0 - 8.3 g/dL   Albumin 3.1 (*) 3.5 - 5.2 g/dL   AST 12  0 - 37 U/L   ALT 9  0 - 35 U/L   Alkaline Phosphatase 54  39 - 117 U/L   Total Bilirubin 0.4  0.3 - 1.2 mg/dL   GFR calc non Af Amer >90  >90 mL/min   GFR calc Af Amer >90  >90 mL/min     Assessment and Plan  A:  Gastroenteritis vs IBS       No leukocytosis or fever       URI, probably viral  P:  Discharge home per Dr Dion Body  Stool sample at home       Get a thermometer and take temp at least once a day       Come back if worsens and/or has fever       Rx meds for URI  [facility] 06/06/2013, 7:51 PM

## 2013-06-10 ENCOUNTER — Other Ambulatory Visit: Payer: Self-pay | Admitting: Obstetrics and Gynecology

## 2013-06-10 ENCOUNTER — Other Ambulatory Visit (HOSPITAL_COMMUNITY)
Admission: RE | Admit: 2013-06-10 | Discharge: 2013-06-10 | Disposition: A | Discharge status: Home / Self Care | Payer: BC Managed Care – PPO | Source: Ambulatory Visit | Attending: Obstetrics and Gynecology | Admitting: Obstetrics and Gynecology

## 2013-06-10 DIAGNOSIS — Z01419 Encounter for gynecological examination (general) (routine) without abnormal findings: Secondary | ICD-10-CM | POA: Insufficient documentation

## 2013-06-10 DIAGNOSIS — Z1151 Encounter for screening for human papillomavirus (HPV): Secondary | ICD-10-CM | POA: Insufficient documentation

## 2013-06-10 LAB — OB RESULTS CONSOLE GC/CHLAMYDIA
Chlamydia: NEGATIVE
Gonorrhea: NEGATIVE

## 2013-06-16 ENCOUNTER — Other Ambulatory Visit: Payer: Self-pay | Admitting: Obstetrics and Gynecology

## 2013-06-16 DIAGNOSIS — O09299 Supervision of pregnancy with other poor reproductive or obstetric history, unspecified trimester: Secondary | ICD-10-CM

## 2013-06-17 ENCOUNTER — Ambulatory Visit (HOSPITAL_COMMUNITY)
Admission: RE | Admit: 2013-06-17 | Discharge: 2013-06-17 | Disposition: A | Discharge status: Home / Self Care | Payer: BC Managed Care – PPO | Source: Ambulatory Visit | Attending: Obstetrics and Gynecology | Admitting: Obstetrics and Gynecology

## 2013-06-17 ENCOUNTER — Other Ambulatory Visit: Payer: Self-pay | Admitting: Obstetrics and Gynecology

## 2013-06-17 ENCOUNTER — Other Ambulatory Visit: Payer: Self-pay

## 2013-06-17 DIAGNOSIS — O09299 Supervision of pregnancy with other poor reproductive or obstetric history, unspecified trimester: Secondary | ICD-10-CM

## 2013-06-17 DIAGNOSIS — O3430 Maternal care for cervical incompetence, unspecified trimester: Secondary | ICD-10-CM | POA: Insufficient documentation

## 2013-06-17 DIAGNOSIS — O352XX0 Maternal care for (suspected) hereditary disease in fetus, not applicable or unspecified: Secondary | ICD-10-CM | POA: Insufficient documentation

## 2013-06-17 DIAGNOSIS — O3410 Maternal care for benign tumor of corpus uteri, unspecified trimester: Secondary | ICD-10-CM | POA: Insufficient documentation

## 2013-06-17 DIAGNOSIS — O3431 Maternal care for cervical incompetence, first trimester: Secondary | ICD-10-CM

## 2013-06-17 NOTE — Consult Note (Signed)
Maternal Fetal Medicine Consultation  Requesting Provider(s): Geryl Rankins, MD  Reason for consultation: Possible cervical insufficiency  HPI: Emily Roman is a 32 yo G2P0010 currently at 3 0/7 weeks who was seen for consultation today due to possible incompetent cervix.  Her last pregnancy was complicated by multiple uterine fibroids - at approximately 15 weeks, she developed exquisite abdominal pain that was felt to be secondary to degenerating fibroids that required po Narcotics.  She had some vaginal bleeding - ultrasound at 15 weeks showed funneling to the level of the external os with some debris in the amniotic cavity.  She was not deemed a candidate for rescue cerclage and shortly thereafter experienced ruptured membranes and went on to deliver a previable infant.  Since her delivery, she underwent a laparoscopic myomectomy with [facility] infertility.  Her prenatal course thus far has been uncomplicated.  OB History: OB History   Grav Para Term Preterm Abortions TAB SAB Ect Mult Living   2 1 0 0 0 0 0 0 0 0       PMH:  Past Medical History  Diagnosis Date  . Wolff-Parkinson-White (WPW) syndrome 2007    ablation done- no further probs    PSH:  Past Surgical History  Procedure Laterality Date  . Eye surgery    . Ablation  2007    cardiac ablation  . Laparoscopy N/A 01/03/2013    Procedure: LAPAROSCOPY OPERATIVE;  Surgeon: Fermin Schwab, MD;  Location: [facility];  Service: Gynecology;  Laterality: N/A;  gel port assist myomyectomy. Enterolysis and repair of cecum Serosa.    . Laparoscopic lysis of adhesions N/A 01/03/2013    Procedure: LAPAROSCOPIC LYSIS OF ADHESIONS;  Surgeon: Fermin Schwab, MD;  Location: [facility];  Service: Gynecology;  Laterality: N/A;   Meds: PNV   Allergies: Shellfish   FH: denies family history of birth defects or hereditary disorders.  Family hx of CHTN (mother, father) and kidney disease (father)  Soc: denies tobacco or ETOH use  Review of  Systems: no vaginal bleeding or cramping/contractions, no LOF, no nausea/vomiting. All other systems reviewed and are negative.  PE: BP: 115/73, P 104, Wt: 150#  GEN: well-appearing female ABD: gravid, NT  Ultrasound: Single IUP at 12 0/7 weeks NT of 1.7 mm noted.  Nasal bone visualized First trimester aneuploidy screen performed as noted above.   Cervix appears normal (3.2 cm transabdominally)  Anterior uterine fibroid (1.8 x 2.7 x 2.3 cm)   A/P: 1) Single IUP at 12 0/7 weeks - first trimester screen performed at patient's request.  Normal NT         2) Previous 15 week loss - Based on patient history, it is difficult to determine whether or not the loss was actually due to incompetent cervix.  With "exquisite pain" and vaginal bleeding, would be more concerned about the possibility of abruption.  It is difficult to determine what role the diffuse uterine fibroids (possibly degenerating fibroids) played in the loss.  We had a long discussion regarding the risks and benefits of prophylactic cerclage versus following cervical lengths and performing an ultrasound indicated cerclage if cervical shortening is noted.  The risks of prophylactic cerclage include the chance that needless surgery might be performed and the risk that it could actually worsen outcomes.  The risk of following cervical lengths as opposed to prophylactic cerclage is the chance that she could change her cervix dramatically over the course of observation that could make cerclage difficult and less likely to be successful.  After our discussion, the patient elected to undergo cervical length surveillance.  She will return at 16 weeks and plan to follow cervical lengths weekly.  If cervical shortening is noted, would have a low threshold for cerclage. Would also recommend initiating vaginal progesterone (Prometrium 200 mg vaginally qHS) beginning at 16-20 weeks.   Thank you for the opportunity to be a part of the care of  [employer]. Please contact our office if we can be of further assistance.   I spent approximately 30 minutes with this patient with over 50% of time spent in face-to-face counseling.  Alpha Gula, MD Maternal-Fetal Medicine

## 2013-07-11 ENCOUNTER — Other Ambulatory Visit: Payer: Self-pay | Admitting: Obstetrics and Gynecology

## 2013-07-11 DIAGNOSIS — O352XX0 Maternal care for (suspected) hereditary disease in fetus, not applicable or unspecified: Secondary | ICD-10-CM

## 2013-07-11 DIAGNOSIS — O3430 Maternal care for cervical incompetence, unspecified trimester: Secondary | ICD-10-CM

## 2013-07-16 ENCOUNTER — Ambulatory Visit (HOSPITAL_COMMUNITY)
Admission: RE | Admit: 2013-07-16 | Discharge: 2013-07-16 | Disposition: A | Discharge status: Home / Self Care | Payer: BC Managed Care – PPO | Source: Ambulatory Visit | Attending: Obstetrics and Gynecology | Admitting: Obstetrics and Gynecology

## 2013-07-16 VITALS — BP 108/68 | HR 82 | Wt 156.0 lb

## 2013-07-16 DIAGNOSIS — O3430 Maternal care for cervical incompetence, unspecified trimester: Secondary | ICD-10-CM

## 2013-07-16 DIAGNOSIS — O3410 Maternal care for benign tumor of corpus uteri, unspecified trimester: Secondary | ICD-10-CM | POA: Insufficient documentation

## 2013-07-16 DIAGNOSIS — O352XX0 Maternal care for (suspected) hereditary disease in fetus, not applicable or unspecified: Secondary | ICD-10-CM | POA: Insufficient documentation

## 2013-07-16 DIAGNOSIS — O09299 Supervision of pregnancy with other poor reproductive or obstetric history, unspecified trimester: Secondary | ICD-10-CM | POA: Insufficient documentation

## 2013-07-16 NOTE — Progress Notes (Signed)
Emily Roman  was seen today for an ultrasound appointment.  See full report in [facility].  Impression: Single IUP at 16 1/7 weeks Hx of 15 week loss associated with shortened cervix ? abruption TVUS - cervical length 3.3 cm without funneling or dynamic changes Anterior uterine fibroid noted as described above  Recommendations: Recommend follow-up ultrasound examination in 2 weeks for cervical length and anatomy  Alpha Gula, MD

## 2013-07-31 ENCOUNTER — Ambulatory Visit (HOSPITAL_COMMUNITY)
Admission: RE | Admit: 2013-07-31 | Discharge: 2013-07-31 | Disposition: A | Discharge status: Home / Self Care | Payer: BC Managed Care – PPO | Source: Ambulatory Visit | Attending: Obstetrics and Gynecology | Admitting: Obstetrics and Gynecology

## 2013-07-31 ENCOUNTER — Other Ambulatory Visit: Payer: Self-pay | Admitting: Obstetrics and Gynecology

## 2013-07-31 ENCOUNTER — Other Ambulatory Visit (HOSPITAL_COMMUNITY): Payer: Self-pay | Admitting: Maternal and Fetal Medicine

## 2013-07-31 DIAGNOSIS — O352XX0 Maternal care for (suspected) hereditary disease in fetus, not applicable or unspecified: Secondary | ICD-10-CM

## 2013-07-31 DIAGNOSIS — O3430 Maternal care for cervical incompetence, unspecified trimester: Secondary | ICD-10-CM

## 2013-07-31 DIAGNOSIS — O3410 Maternal care for benign tumor of corpus uteri, unspecified trimester: Secondary | ICD-10-CM | POA: Insufficient documentation

## 2013-07-31 DIAGNOSIS — O358XX0 Maternal care for other (suspected) fetal abnormality and damage, not applicable or unspecified: Secondary | ICD-10-CM | POA: Insufficient documentation

## 2013-07-31 DIAGNOSIS — Z1389 Encounter for screening for other disorder: Secondary | ICD-10-CM | POA: Insufficient documentation

## 2013-07-31 DIAGNOSIS — O09299 Supervision of pregnancy with other poor reproductive or obstetric history, unspecified trimester: Secondary | ICD-10-CM | POA: Insufficient documentation

## 2013-07-31 DIAGNOSIS — Z363 Encounter for antenatal screening for malformations: Secondary | ICD-10-CM | POA: Insufficient documentation

## 2013-08-14 ENCOUNTER — Ambulatory Visit (HOSPITAL_COMMUNITY)
Admission: RE | Admit: 2013-08-14 | Discharge: 2013-08-14 | Disposition: A | Discharge status: Home / Self Care | Payer: BC Managed Care – PPO | Source: Ambulatory Visit | Attending: Obstetrics and Gynecology | Admitting: Obstetrics and Gynecology

## 2013-08-14 ENCOUNTER — Encounter (HOSPITAL_COMMUNITY): Payer: Self-pay

## 2013-08-14 DIAGNOSIS — O3430 Maternal care for cervical incompetence, unspecified trimester: Secondary | ICD-10-CM | POA: Insufficient documentation

## 2013-08-14 DIAGNOSIS — O09299 Supervision of pregnancy with other poor reproductive or obstetric history, unspecified trimester: Secondary | ICD-10-CM | POA: Insufficient documentation

## 2013-08-14 DIAGNOSIS — O3410 Maternal care for benign tumor of corpus uteri, unspecified trimester: Secondary | ICD-10-CM | POA: Insufficient documentation

## 2013-08-14 DIAGNOSIS — O352XX0 Maternal care for (suspected) hereditary disease in fetus, not applicable or unspecified: Secondary | ICD-10-CM | POA: Insufficient documentation

## 2013-08-28 ENCOUNTER — Ambulatory Visit (HOSPITAL_COMMUNITY)
Admission: RE | Admit: 2013-08-28 | Discharge: 2013-08-28 | Disposition: A | Discharge status: Home / Self Care | Payer: BC Managed Care – PPO | Source: Ambulatory Visit | Attending: Obstetrics and Gynecology | Admitting: Obstetrics and Gynecology

## 2013-08-28 DIAGNOSIS — O3410 Maternal care for benign tumor of corpus uteri, unspecified trimester: Secondary | ICD-10-CM | POA: Insufficient documentation

## 2013-08-28 DIAGNOSIS — O3430 Maternal care for cervical incompetence, unspecified trimester: Secondary | ICD-10-CM | POA: Insufficient documentation

## 2013-08-28 DIAGNOSIS — O352XX0 Maternal care for (suspected) hereditary disease in fetus, not applicable or unspecified: Secondary | ICD-10-CM

## 2013-09-18 ENCOUNTER — Other Ambulatory Visit: Payer: Self-pay

## 2013-09-23 LAB — OB RESULTS CONSOLE HIV ANTIBODY (ROUTINE TESTING): HIV: NONREACTIVE

## 2013-09-23 LAB — OB RESULTS CONSOLE RPR: RPR: NONREACTIVE

## 2013-10-14 ENCOUNTER — Other Ambulatory Visit: Payer: Self-pay | Admitting: Obstetrics and Gynecology

## 2013-10-15 ENCOUNTER — Inpatient Hospital Stay (HOSPITAL_COMMUNITY)
Admission: AD | Admit: 2013-10-15 | Discharge: 2013-10-15 | Disposition: A | Discharge status: Home / Self Care | Payer: BC Managed Care – PPO | Source: Ambulatory Visit | Attending: Obstetrics and Gynecology | Admitting: Obstetrics and Gynecology

## 2013-10-15 ENCOUNTER — Other Ambulatory Visit: Payer: Self-pay | Admitting: Obstetrics and Gynecology

## 2013-10-15 DIAGNOSIS — O4700 False labor before 37 completed weeks of gestation, unspecified trimester: Secondary | ICD-10-CM | POA: Insufficient documentation

## 2013-10-15 DIAGNOSIS — N888 Other specified noninflammatory disorders of cervix uteri: Secondary | ICD-10-CM

## 2013-10-15 MED ORDER — BETAMETHASONE SOD PHOS & ACET 6 (3-3) MG/ML IJ SUSP
12.0000 mg | INTRAMUSCULAR | Status: DC
  Administered 2013-10-15: 12 mg via INTRAMUSCULAR
  Filled 2013-10-15: qty 2

## 2013-10-15 NOTE — Progress Notes (Signed)
Patient did not have any reaction to the medication. She is aware to return tomorrow for her 2nd dose.

## 2013-10-16 ENCOUNTER — Inpatient Hospital Stay (HOSPITAL_COMMUNITY)
Admission: AD | Admit: 2013-10-16 | Discharge: 2013-10-16 | Disposition: A | Discharge status: Home / Self Care | Payer: BC Managed Care – PPO | Source: Ambulatory Visit | Attending: Obstetrics and Gynecology | Admitting: Obstetrics and Gynecology

## 2013-10-16 ENCOUNTER — Ambulatory Visit (HOSPITAL_COMMUNITY): Payer: BC Managed Care – PPO

## 2013-10-16 DIAGNOSIS — O4700 False labor before 37 completed weeks of gestation, unspecified trimester: Secondary | ICD-10-CM | POA: Insufficient documentation

## 2013-10-16 MED ORDER — BETAMETHASONE SOD PHOS & ACET 6 (3-3) MG/ML IJ SUSP
12.0000 mg | Freq: Once | INTRAMUSCULAR | Status: AC
  Administered 2013-10-16: 12 mg via INTRAMUSCULAR
  Filled 2013-10-16: qty 2

## 2013-11-24 ENCOUNTER — Encounter (HOSPITAL_COMMUNITY): Payer: Self-pay | Admitting: Pharmacist

## 2013-12-02 ENCOUNTER — Inpatient Hospital Stay (HOSPITAL_COMMUNITY)
Admission: AD | Admit: 2013-12-02 | Discharge: 2013-12-06 | DRG: 765 | Disposition: A | Discharge status: Home / Self Care | Payer: BC Managed Care – PPO | Source: Ambulatory Visit | Attending: Obstetrics and Gynecology | Admitting: Obstetrics and Gynecology

## 2013-12-02 ENCOUNTER — Other Ambulatory Visit: Payer: Self-pay | Admitting: Obstetrics and Gynecology

## 2013-12-02 ENCOUNTER — Encounter (HOSPITAL_COMMUNITY): Payer: Self-pay | Admitting: *Deleted

## 2013-12-02 DIAGNOSIS — Q249 Congenital malformation of heart, unspecified: Secondary | ICD-10-CM

## 2013-12-02 DIAGNOSIS — O09299 Supervision of pregnancy with other poor reproductive or obstetric history, unspecified trimester: Secondary | ICD-10-CM

## 2013-12-02 DIAGNOSIS — IMO0002 Reserved for concepts with insufficient information to code with codable children: Principal | ICD-10-CM | POA: Diagnosis present

## 2013-12-02 DIAGNOSIS — Q289 Congenital malformation of circulatory system, unspecified: Secondary | ICD-10-CM

## 2013-12-02 DIAGNOSIS — O3430 Maternal care for cervical incompetence, unspecified trimester: Secondary | ICD-10-CM | POA: Diagnosis present

## 2013-12-02 DIAGNOSIS — O365990 Maternal care for other known or suspected poor fetal growth, unspecified trimester, not applicable or unspecified: Secondary | ICD-10-CM | POA: Diagnosis present

## 2013-12-02 DIAGNOSIS — Q248 Other specified congenital malformations of heart: Secondary | ICD-10-CM

## 2013-12-02 DIAGNOSIS — Z98891 History of uterine scar from previous surgery: Secondary | ICD-10-CM

## 2013-12-02 DIAGNOSIS — O1400 Mild to moderate pre-eclampsia, unspecified trimester: Secondary | ICD-10-CM | POA: Diagnosis present

## 2013-12-02 DIAGNOSIS — O9942 Diseases of the circulatory system complicating childbirth: Secondary | ICD-10-CM | POA: Diagnosis present

## 2013-12-02 LAB — COMPREHENSIVE METABOLIC PANEL
ALBUMIN: 2.4 g/dL — AB (ref 3.5–5.2)
ALT: 11 U/L (ref 0–35)
AST: 15 U/L (ref 0–37)
Alkaline Phosphatase: 130 U/L — ABNORMAL HIGH (ref 39–117)
BILIRUBIN TOTAL: 0.4 mg/dL (ref 0.3–1.2)
BUN: 11 mg/dL (ref 6–23)
CHLORIDE: 104 meq/L (ref 96–112)
CO2: 21 mEq/L (ref 19–32)
CREATININE: 0.66 mg/dL (ref 0.50–1.10)
Calcium: 9.2 mg/dL (ref 8.4–10.5)
GFR calc Af Amer: >90 mL/min (ref >90)
GFR calc non Af Amer: >90 mL/min (ref >90)
Glucose, Bld: 106 mg/dL — ABNORMAL HIGH (ref 70–99)
Potassium: 3.6 mEq/L — ABNORMAL LOW (ref 3.7–5.3)
Sodium: 139 mEq/L (ref 137–147)
Total Protein: 5.7 g/dL — ABNORMAL LOW (ref 6.0–8.3)

## 2013-12-02 LAB — LACTATE DEHYDROGENASE: LDH: 191 U/L (ref 94–250)

## 2013-12-02 LAB — CBC
HEMATOCRIT: 34.6 % — AB (ref 36.0–46.0)
HEMOGLOBIN: 12.1 g/dL (ref 12.0–15.0)
MCH: 31.6 pg (ref 26.0–34.0)
MCHC: 35 g/dL (ref 30.0–36.0)
MCV: 90.3 fL (ref 78.0–100.0)
PLATELETS: 162 10*3/uL (ref 150–400)
RBC: 3.83 MIL/uL — AB (ref 3.87–5.11)
RDW: 12.8 % (ref 11.5–15.5)
WBC: 7.1 10*3/uL (ref 4.0–10.5)

## 2013-12-02 LAB — URIC ACID: Uric Acid, Serum: 5.3 mg/dL (ref 2.4–7.0)

## 2013-12-02 MED ORDER — PRENATAL MULTIVITAMIN CH: 1.0000 | ORAL_TABLET | Freq: Every day | ORAL | Status: DC

## 2013-12-02 MED ORDER — CALCIUM CARBONATE ANTACID 500 MG PO CHEW: 2.0000 | CHEWABLE_TABLET | ORAL | Status: DC | PRN

## 2013-12-02 MED ORDER — DOCUSATE SODIUM 100 MG PO CAPS: 100.0000 mg | ORAL_CAPSULE | Freq: Every day | ORAL | Status: DC

## 2013-12-02 MED ORDER — ACETAMINOPHEN 325 MG PO TABS: 650.0000 mg | ORAL_TABLET | ORAL | Status: DC | PRN

## 2013-12-02 MED ORDER — ZOLPIDEM TARTRATE 5 MG PO TABS: 5.0000 mg | ORAL_TABLET | Freq: Every evening | ORAL | Status: DC | PRN

## 2013-12-03 ENCOUNTER — Ambulatory Visit (HOSPITAL_COMMUNITY): Payer: BC Managed Care – PPO

## 2013-12-03 ENCOUNTER — Inpatient Hospital Stay (HOSPITAL_COMMUNITY): Payer: BC Managed Care – PPO

## 2013-12-03 ENCOUNTER — Encounter (HOSPITAL_COMMUNITY)
Admission: AD | Disposition: A | Discharge status: Home / Self Care | Payer: Self-pay | Source: Ambulatory Visit | Attending: Obstetrics and Gynecology

## 2013-12-03 ENCOUNTER — Inpatient Hospital Stay (HOSPITAL_COMMUNITY): Payer: BC Managed Care – PPO | Admitting: Anesthesiology

## 2013-12-03 ENCOUNTER — Encounter (HOSPITAL_COMMUNITY): Payer: Self-pay | Admitting: Anesthesiology

## 2013-12-03 ENCOUNTER — Encounter (HOSPITAL_COMMUNITY): Payer: BC Managed Care – PPO | Admitting: Anesthesiology

## 2013-12-03 LAB — CULTURE, BETA STREP (GROUP B ONLY)

## 2013-12-03 LAB — PREPARE RBC (CROSSMATCH)

## 2013-12-03 SURGERY — Surgical Case
Anesthesia: Spinal

## 2013-12-03 MED ORDER — DIPHENHYDRAMINE HCL 25 MG PO CAPS: 25.0000 mg | ORAL_CAPSULE | ORAL | Status: DC | PRN

## 2013-12-03 MED ORDER — LACTATED RINGERS IV SOLN
INTRAVENOUS | Status: DC
  Administered 2013-12-03: 22:00:00 via INTRAVENOUS

## 2013-12-03 MED ORDER — DEXTROSE IN LACTATED RINGERS 5 % IV SOLN
INTRAVENOUS | Status: DC
  Administered 2013-12-03: 11:00:00 via INTRAVENOUS

## 2013-12-03 MED ORDER — SCOPOLAMINE 1 MG/3DAYS TD PT72
MEDICATED_PATCH | TRANSDERMAL | Status: AC
  Administered 2013-12-03: 1.5 mg via TRANSDERMAL
  Filled 2013-12-03: qty 1

## 2013-12-03 MED ORDER — 0.9 % SODIUM CHLORIDE (POUR BTL) OPTIME
TOPICAL | Status: DC | PRN
  Administered 2013-12-03: 1000 mL

## 2013-12-03 MED ORDER — KETOROLAC TROMETHAMINE 30 MG/ML IJ SOLN: 30.0000 mg | Freq: Four times a day (QID) | INTRAMUSCULAR | Status: DC | PRN

## 2013-12-03 MED ORDER — OXYCODONE-ACETAMINOPHEN 5-325 MG PO TABS: 1.0000 | ORAL_TABLET | ORAL | Status: DC | PRN

## 2013-12-03 MED ORDER — CEFAZOLIN SODIUM-DEXTROSE 2-3 GM-% IV SOLR
2.0000 g | INTRAVENOUS | Status: DC
  Filled 2013-12-03: qty 50

## 2013-12-03 MED ORDER — CITRIC ACID-SODIUM CITRATE 334-500 MG/5ML PO SOLN
ORAL | Status: AC
  Administered 2013-12-03: 30 mL
  Filled 2013-12-03: qty 15

## 2013-12-03 MED ORDER — KETOROLAC TROMETHAMINE 30 MG/ML IJ SOLN
30.0000 mg | Freq: Four times a day (QID) | INTRAMUSCULAR | Status: DC | PRN
  Administered 2013-12-03: 30 mg via INTRAMUSCULAR

## 2013-12-03 MED ORDER — MEASLES, MUMPS & RUBELLA VAC ~~LOC~~ INJ
0.5000 mL | INJECTION | Freq: Once | SUBCUTANEOUS | Status: DC
  Filled 2013-12-03: qty 0.5

## 2013-12-03 MED ORDER — NALOXONE HCL 0.4 MG/ML IJ SOLN
0.4000 mg | INTRAMUSCULAR | Status: DC | PRN
Start: 2013-12-03 — End: 2013-12-06

## 2013-12-03 MED ORDER — NALBUPHINE HCL 10 MG/ML IJ SOLN
5.0000 mg | INTRAMUSCULAR | Status: DC | PRN
  Filled 2013-12-03: qty 1

## 2013-12-03 MED ORDER — PHENYLEPHRINE 8 MG IN D5W 100 ML (0.08MG/ML) PREMIX OPTIME
INJECTION | INTRAVENOUS | Status: AC
  Filled 2013-12-03: qty 100

## 2013-12-03 MED ORDER — BUPIVACAINE IN DEXTROSE 0.75-8.25 % IT SOLN
INTRATHECAL | Status: DC | PRN
  Administered 2013-12-03: 1.6 mL via INTRATHECAL

## 2013-12-03 MED ORDER — DIBUCAINE 1 % RE OINT: 1.0000 "application " | TOPICAL_OINTMENT | RECTAL | Status: DC | PRN

## 2013-12-03 MED ORDER — OXYTOCIN 40 UNITS IN LACTATED RINGERS INFUSION - SIMPLE MED: 62.5000 mL/h | INTRAVENOUS | Status: DC

## 2013-12-03 MED ORDER — TETANUS-DIPHTH-ACELL PERTUSSIS 5-2.5-18.5 LF-MCG/0.5 IM SUSP
0.5000 mL | Freq: Once | INTRAMUSCULAR | Status: DC
  Filled 2013-12-03: qty 0.5

## 2013-12-03 MED ORDER — SIMETHICONE 80 MG PO CHEW: 80.0000 mg | CHEWABLE_TABLET | ORAL | Status: DC | PRN

## 2013-12-03 MED ORDER — ONDANSETRON HCL 4 MG/2ML IJ SOLN: 4.0000 mg | Freq: Three times a day (TID) | INTRAMUSCULAR | Status: DC | PRN

## 2013-12-03 MED ORDER — PRENATAL MULTIVITAMIN CH
1.0000 | ORAL_TABLET | Freq: Every day | ORAL | Status: DC
  Administered 2013-12-04 – 2013-12-05 (×2): 1 via ORAL
  Filled 2013-12-03 (×2): qty 1

## 2013-12-03 MED ORDER — METOCLOPRAMIDE HCL 5 MG/ML IJ SOLN: 10.0000 mg | Freq: Three times a day (TID) | INTRAMUSCULAR | Status: DC | PRN

## 2013-12-03 MED ORDER — ONDANSETRON HCL 4 MG/2ML IJ SOLN
INTRAMUSCULAR | Status: AC
  Filled 2013-12-03: qty 2

## 2013-12-03 MED ORDER — FENTANYL CITRATE 0.05 MG/ML IJ SOLN
INTRAMUSCULAR | Status: AC
  Filled 2013-12-03: qty 2

## 2013-12-03 MED ORDER — IBUPROFEN 600 MG PO TABS
600.0000 mg | ORAL_TABLET | Freq: Four times a day (QID) | ORAL | Status: DC
  Administered 2013-12-03 – 2013-12-05 (×8): 600 mg via ORAL
  Filled 2013-12-03 (×9): qty 1

## 2013-12-03 MED ORDER — ONDANSETRON HCL 4 MG/2ML IJ SOLN: 4.0000 mg | INTRAMUSCULAR | Status: DC | PRN

## 2013-12-03 MED ORDER — MISOPROSTOL 200 MCG PO TABS
200.0000 ug | ORAL_TABLET | Freq: Four times a day (QID) | ORAL | Status: AC
  Administered 2013-12-03 – 2013-12-04 (×4): 200 ug via ORAL
  Filled 2013-12-03 (×4): qty 1

## 2013-12-03 MED ORDER — SIMETHICONE 80 MG PO CHEW
80.0000 mg | CHEWABLE_TABLET | ORAL | Status: DC
  Administered 2013-12-03 – 2013-12-05 (×2): 80 mg via ORAL
  Filled 2013-12-03: qty 1

## 2013-12-03 MED ORDER — MORPHINE SULFATE (PF) 0.5 MG/ML IJ SOLN
INTRAMUSCULAR | Status: DC | PRN
  Administered 2013-12-03: .15 mg via INTRATHECAL

## 2013-12-03 MED ORDER — NALOXONE HCL 1 MG/ML IJ SOLN
1.0000 ug/kg/h | INTRAVENOUS | Status: DC | PRN
  Filled 2013-12-03: qty 2

## 2013-12-03 MED ORDER — FENTANYL CITRATE 0.05 MG/ML IJ SOLN
INTRAMUSCULAR | Status: DC | PRN
  Administered 2013-12-03: 25 ug via INTRATHECAL

## 2013-12-03 MED ORDER — MORPHINE SULFATE 0.5 MG/ML IJ SOLN
INTRAMUSCULAR | Status: AC
  Filled 2013-12-03: qty 10

## 2013-12-03 MED ORDER — SENNOSIDES-DOCUSATE SODIUM 8.6-50 MG PO TABS
2.0000 | ORAL_TABLET | ORAL | Status: DC
  Administered 2013-12-03 – 2013-12-05 (×2): 2 via ORAL
  Filled 2013-12-03 (×3): qty 2

## 2013-12-03 MED ORDER — MENTHOL 3 MG MT LOZG
1.0000 | LOZENGE | OROMUCOSAL | Status: DC | PRN
Start: 2013-12-03 — End: 2013-12-06

## 2013-12-03 MED ORDER — OXYTOCIN 10 UNIT/ML IJ SOLN
INTRAMUSCULAR | Status: AC
  Filled 2013-12-03: qty 4

## 2013-12-03 MED ORDER — OXYTOCIN 10 UNIT/ML IJ SOLN
40.0000 [IU] | INTRAVENOUS | Status: DC | PRN
  Administered 2013-12-03: 40 [IU] via INTRAVENOUS

## 2013-12-03 MED ORDER — MEPERIDINE HCL 25 MG/ML IJ SOLN: 6.2500 mg | INTRAMUSCULAR | Status: DC | PRN

## 2013-12-03 MED ORDER — MIDAZOLAM HCL 2 MG/2ML IJ SOLN
INTRAMUSCULAR | Status: AC
  Filled 2013-12-03: qty 2

## 2013-12-03 MED ORDER — ONDANSETRON HCL 4 MG/2ML IJ SOLN
INTRAMUSCULAR | Status: DC | PRN
  Administered 2013-12-03: 4 mg via INTRAVENOUS

## 2013-12-03 MED ORDER — LACTATED RINGERS IV SOLN
INTRAVENOUS | Status: DC | PRN
  Administered 2013-12-03 (×2): via INTRAVENOUS

## 2013-12-03 MED ORDER — MAGNESIUM SULFATE 40 G IN LACTATED RINGERS - SIMPLE
2.0000 g/h | INTRAVENOUS | Status: AC
  Administered 2013-12-03: 2 g/h via INTRAVENOUS
  Filled 2013-12-03: qty 500

## 2013-12-03 MED ORDER — MAGNESIUM SULFATE BOLUS VIA INFUSION
4.0000 g | Freq: Once | INTRAVENOUS | Status: AC
  Administered 2013-12-03: 4 g via INTRAVENOUS
  Filled 2013-12-03: qty 500

## 2013-12-03 MED ORDER — LANOLIN HYDROUS EX OINT: 1.0000 "application " | TOPICAL_OINTMENT | CUTANEOUS | Status: DC | PRN

## 2013-12-03 MED ORDER — SCOPOLAMINE 1 MG/3DAYS TD PT72
1.0000 | MEDICATED_PATCH | Freq: Once | TRANSDERMAL | Status: DC
  Administered 2013-12-03: 1.5 mg via TRANSDERMAL

## 2013-12-03 MED ORDER — SODIUM CHLORIDE 0.9 % IJ SOLN: 3.0000 mL | INTRAMUSCULAR | Status: DC | PRN

## 2013-12-03 MED ORDER — KETOROLAC TROMETHAMINE 30 MG/ML IJ SOLN
INTRAMUSCULAR | Status: AC
  Administered 2013-12-03: 30 mg via INTRAMUSCULAR
  Filled 2013-12-03: qty 1

## 2013-12-03 MED ORDER — ONDANSETRON HCL 4 MG PO TABS: 4.0000 mg | ORAL_TABLET | ORAL | Status: DC | PRN

## 2013-12-03 MED ORDER — DIPHENHYDRAMINE HCL 50 MG/ML IJ SOLN: 25.0000 mg | INTRAMUSCULAR | Status: DC | PRN

## 2013-12-03 MED ORDER — PHENYLEPHRINE 8 MG IN D5W 100 ML (0.08MG/ML) PREMIX OPTIME
INJECTION | INTRAVENOUS | Status: DC | PRN
  Administered 2013-12-03: 60 ug/min via INTRAVENOUS

## 2013-12-03 MED ORDER — DIPHENHYDRAMINE HCL 25 MG PO CAPS: 25.0000 mg | ORAL_CAPSULE | Freq: Four times a day (QID) | ORAL | Status: DC | PRN

## 2013-12-03 MED ORDER — ZOLPIDEM TARTRATE 5 MG PO TABS
5.0000 mg | ORAL_TABLET | Freq: Every evening | ORAL | Status: DC | PRN
Start: 2013-12-03 — End: 2013-12-05

## 2013-12-03 MED ORDER — FENTANYL CITRATE 0.05 MG/ML IJ SOLN: 25.0000 ug | INTRAMUSCULAR | Status: DC | PRN

## 2013-12-03 MED ORDER — DIPHENHYDRAMINE HCL 50 MG/ML IJ SOLN: 12.5000 mg | INTRAMUSCULAR | Status: DC | PRN

## 2013-12-03 MED ORDER — WITCH HAZEL-GLYCERIN EX PADS: 1.0000 "application " | MEDICATED_PAD | CUTANEOUS | Status: DC | PRN

## 2013-12-03 MED ORDER — SIMETHICONE 80 MG PO CHEW
80.0000 mg | CHEWABLE_TABLET | Freq: Three times a day (TID) | ORAL | Status: DC
  Administered 2013-12-03 – 2013-12-05 (×7): 80 mg via ORAL
  Filled 2013-12-03 (×8): qty 1

## 2013-12-03 SURGICAL SUPPLY — 39 items
BARRIER ADHS 3X4 INTERCEED (GAUZE/BANDAGES/DRESSINGS) ×3 IMPLANT
BENZOIN TINCTURE PRP APPL 2/3 (GAUZE/BANDAGES/DRESSINGS) ×3 IMPLANT
CLAMP CORD UMBIL (MISCELLANEOUS) IMPLANT
CLOSURE WOUND 1/2 X4 (GAUZE/BANDAGES/DRESSINGS) ×1
CLOTH BEACON ORANGE TIMEOUT ST (SAFETY) ×3 IMPLANT
DERMABOND ADVANCED (GAUZE/BANDAGES/DRESSINGS)
DERMABOND ADVANCED .7 DNX12 (GAUZE/BANDAGES/DRESSINGS) IMPLANT
DRAPE LG THREE QUARTER DISP (DRAPES) IMPLANT
DRSG OPSITE POSTOP 4X10 (GAUZE/BANDAGES/DRESSINGS) ×3 IMPLANT
DURAPREP 26ML APPLICATOR (WOUND CARE) ×3 IMPLANT
ELECT REM PT RETURN 9FT ADLT (ELECTROSURGICAL) ×3
ELECTRODE REM PT RTRN 9FT ADLT (ELECTROSURGICAL) ×1 IMPLANT
EXTRACTOR VACUUM BELL STYLE (SUCTIONS) IMPLANT
GLOVE BIO SURGEON STRL SZ7 (GLOVE) ×6 IMPLANT
GLOVE BIOGEL PI IND STRL 7.0 (GLOVE) ×1 IMPLANT
GLOVE BIOGEL PI INDICATOR 7.0 (GLOVE) ×2
GOWN PREVENTION PLUS XLARGE (GOWN DISPOSABLE) ×6 IMPLANT
GOWN STRL REIN XL XLG (GOWN DISPOSABLE) ×6 IMPLANT
KIT ABG SYR 3ML LUER SLIP (SYRINGE) IMPLANT
NEEDLE HYPO 25X5/8 SAFETYGLIDE (NEEDLE) IMPLANT
NS IRRIG 1000ML POUR BTL (IV SOLUTION) ×3 IMPLANT
PACK C SECTION WH (CUSTOM PROCEDURE TRAY) ×3 IMPLANT
PAD ABD 7.5X8 STRL (GAUZE/BANDAGES/DRESSINGS) ×3 IMPLANT
PAD OB MATERNITY 4.3X12.25 (PERSONAL CARE ITEMS) ×3 IMPLANT
RTRCTR C-SECT PINK 25CM LRG (MISCELLANEOUS) ×3 IMPLANT
STRIP CLOSURE SKIN 1/2X4 (GAUZE/BANDAGES/DRESSINGS) ×2 IMPLANT
SUT CHROMIC 0 CTX 36 (SUTURE) ×15 IMPLANT
SUT PLAIN 2 0 (SUTURE)
SUT PLAIN 2 0 XLH (SUTURE) ×3 IMPLANT
SUT PLAIN ABS 2-0 54XMFL TIE (SUTURE) IMPLANT
SUT VIC AB 0 CT1 27 (SUTURE) ×4
SUT VIC AB 0 CT1 27XBRD ANBCTR (SUTURE) ×2 IMPLANT
SUT VIC AB 2-0 CT1 27 (SUTURE) ×2
SUT VIC AB 2-0 CT1 TAPERPNT 27 (SUTURE) ×1 IMPLANT
SUT VIC AB 4-0 KS 27 (SUTURE) ×3 IMPLANT
TAPE CLOTH SURG 4X10 WHT LF (GAUZE/BANDAGES/DRESSINGS) ×3 IMPLANT
TOWEL OR 17X24 6PK STRL BLUE (TOWEL DISPOSABLE) ×3 IMPLANT
TRAY FOLEY CATH 14FR (SET/KITS/TRAYS/PACK) IMPLANT
WATER STERILE IRR 1000ML POUR (IV SOLUTION) ×3 IMPLANT

## 2013-12-03 NOTE — Transfer of Care (Signed)
Immediate Anesthesia Transfer of Care Note  Patient: Emily Roman  Procedure(s) Performed: Procedure(s): CESAREAN SECTION (N/A)  Patient Location: PACU  Anesthesia Type:Spinal  Level of Consciousness: awake, alert , oriented and patient cooperative  Airway & Oxygen Therapy: Patient Spontanous Breathing  Post-op Assessment: Report given to PACU RN and Post -op Vital signs reviewed and stable  Post vital signs: Reviewed and stable  Complications: No apparent anesthesia complications

## 2013-12-03 NOTE — Plan of Care (Signed)
Problem: Consults Goal: Birthing Suites Patient Information Press F2 to bring up selections list  Outcome: Not Applicable Date Met:  79/53/69  Pt <[redacted] weeks gestation

## 2013-12-03 NOTE — Anesthesia Postprocedure Evaluation (Signed)
  Anesthesia Post-op Note  Patient: Emily Roman  Procedure(s) Performed: Procedure(s): CESAREAN SECTION (N/A)  Patient Location: PACU  Anesthesia Type:Spinal  Level of Consciousness: awake, alert  and oriented  Airway and Oxygen Therapy: Patient Spontanous Breathing  Post-op Pain: none  Post-op Assessment: Post-op Vital signs reviewed, Patient's Cardiovascular Status Stable, No headache, No backache, No residual numbness and No residual motor weakness  Post-op Vital Signs: Reviewed and stable  Complications: No apparent anesthesia complications

## 2013-12-03 NOTE — H&P (Addendum)
History of Present Illness  General:  33 y/o G2 P0010 at 68 0/7 weeks c/w a 10 week ultrasound. Pregnancy complicated by Incompetent Cervix at 26 weeks, and fibroids. H/o myomectomy of large fibroids. Primary cesarean section scheduled 1/27. Pt presented for routine ob visit yesterday. BP was 136/80 but urine dip showed 1+ protein. Protein/Cr ratio was 2.4. Preeclampsia labs were normal.  Pt denied symptoms of preeclampsia but stated she felt her face looked puffy. Pt's weight was 189 lbs. one week ago and is the same today. Pt has gained 2 lbs since yesterday.   Current Medications  Taking   Prenatal Vitamins (Dis) Tablet    Past Medical History  Fiborids  Incomptent Cervix  H/o Wolff-Parkinson-White Syndrome-s/p ablation   Surgical History  eye surgey 1992, 1993  cardiac catheter ablation 2007  myomectomy 12/2012   Family History  denies any GYN family cancer hx.   Gyn History  Periods : every month.  LMP 03/25/2013.  Denies H/O Birth control.  Last pap smear date 04/2012.  Denies H/O Last mammogram date.  Denies H/O Abnormal pap smear.    OB History  Number of pregnancies 2.  Pregnancy # 1 miscarried at 29 weeks, 2013.    Allergies  SHELLFISH   Hospitalization/Major Diagnostic Procedure  miscarriage at 16 weeks 2013   Review of Systems  Denies headaches, visual changes. Has had abdominal pain near umbilicus1-2 times in a day. Denies vaginal bleeding. Active fetus.   Vital Signs  Wt 191, BP sitting 142/90.   Physical Examination  GENERAL:  Patient appears alert and oriented.  General Appearance: well-appearing, well-developed, no acute distress.  Speech: clear.  LUNGS:  Auscultation: no wheezing/rhonchi/rales. CTA bilaterally.  HEART:  Heart sounds: normal. RRR. no murmur.  ABDOMEN:  General: soft nontender, nondistended, no masses.  FEMALE GENITOURINARY:  Pelvic Not examined.  Cervix Deferred today. 1/30/-2 on 11/25/2103.  EXTREMITIES:  General: No  edema or calf tenderness.     Lab Reports:  Lab:CBC without Diff  WBC -  7.7  4.0-11.0 - K/ul  RBC -  3.99 L 4.20-5.40 - M/uL  HCT -  37.1  37.0-47.0 - %  HGB -  12.8  12.0-16.0 - g/dL  MCV -  93.1  81.0-99.0 - fL  MCH -  32.0  27.0-33.0 - pg  MCHC -  34.4  32.0-36.0 - g/dL  RDW -  13.0  11.5-15.5 - %  PLT -  159  150-400 - K/uL    Reilly Molchan B 12/02/2013 12:32:38 PM > Needs to come back today for evaluation for bp check. Elevated protein. Allman,Michelle 12/02/2013 12:37:18 PM > Pt informed.  NKN:[id number] Metabolic Panel  GLUCOSE -  77   70-99 - mg/dL  BUN -  15   6-26 - mg/dL  CREATININE -  0.76   0.60-1.30 - mg/dl  eGFR (NON-AFRICAN AMERICAN) -  87  >60 - calc  eGFR (AFRICAN AMERICAN) -  106  >60 - calc  SODIUM -  135  L 136-145 - mmol/L  POTASSIUM -  3.9   3.5-5.5 - mmol/L  CHLORIDE -  104   98-107 - mmol/L  C02 -  24   22-32 - mmol/L  ANION GAP -  10.2  6.0-20.0 - mmol/L  CALCIUM -  9.3   8.6-10.3 - mg/dL  T PROTEIN -  5.8  L 6.0-8.3 - g/dL  ALBUMIN -  3.3  L 3.4-4.8 - g/dL  T.BILI -  0.5   0.3-1.0 - mg/dL  ALP -  130  H 38-126 - U/L  AST -  13   0-39 - U/L  ALT -  11   0-52 - U/L    Zerline Melchior B 12/02/2013 12:32:38 PM > Needs to come back today for evaluation for bp check. Elevated protein. Allman,Michelle 12/02/2013 12:37:18 PM > Pt informed.  WFU:[id number], Complete with Micro  USG -  >=1.030  1.010-1.030 -   PH -  6.0  5.0-8.0 -   LEUK. ESTER. -  TRACE  Negative -   BLOOD -  NEGATIVE  Negative - ERY/UL  UR. GLUCOSE -  NEGATIVE  Negative - g/dL  NITRITE -  NEGATIVE  Negative -   UR. PROTEIN -  3+  Negative - mg/dL  KETONES -  NEGATIVE  Negative - mg/dL  UR. BILI -  NEGATIVE  Negative -   UROBILI -  0.2  0.0-1.0 - mg/dL  WBC/hpf -  >20/HPF  0-5 - /HPF  RBC/hpf -  None Seen  0-5 - /HPF  EPITH -  Many  0-5 - /HPF  BACTERIA -  Abundant  Negative - /HPF    Clancy Leiner B 12/02/2013 12:32:38 PM > Needs to come back today for evaluation for bp check.  Elevated protein. Allman,Michelle 12/02/2013 12:37:18 PM > Pt informed.  GUR:KYHCW Protein/Creat Urine (Random) [phone]) 2361  Protein,Total,Urine -  459.9 H 0.0-15.0 - MG/DL  Protein/Creat Ratio -  2361 H 0-200 - MG/G CREAT    Mollye Guinta B 12/02/2013 12:32:38 PM > Needs to come back today for evaluation for bp check. Elevated protein. Allman,Michelle 12/02/2013 12:37:18 PM > Pt informed.   Assessment:  Supervision of other normal pregnancy - V22.1 (Primary), EDD: 12/30/2013     Proteinuria affecting pregnancy, antepartum - 646.23     Mild Preeclampsia    H/O myomectomy - V45.89     Cervical incompetence, antepartum condition or complication - 315.17     Plan:  Treatment:  Elevated BP  Notes: Although BP is mildly elevated, proteinuria is significant suggestive of impending severe preeclampsia. Pt is currently asymptomatic. Admit for inpatient bedrest, 24 hour urine collection, repeat labs and fetal monitoring. MFM in consult in AM.

## 2013-12-03 NOTE — Progress Notes (Signed)
In to sign consents for cesarean section and blood transfusion.  Husband at bedside.  No questions.

## 2013-12-03 NOTE — Progress Notes (Signed)
Ur chart review completed.  

## 2013-12-03 NOTE — Anesthesia Procedure Notes (Signed)

## 2013-12-03 NOTE — Progress Notes (Signed)
Subjective: Postpartum Day 0: Cesarean Delivery Patient reports pain is well controlled.  Feels congested but denies headache or visual changes.  H/o right "lazy eye" s/p surgery.  Objective: Vital signs in last 24 hours: Temp:  [97.5 F (36.4 C)-98.4 F (36.9 C)] 98.3 F (36.8 C) (01/21 1528) Pulse Rate:  [69-98] 98 (01/21 1800) Resp:  [15-22] 18 (01/21 1528) BP: (104-151)/(68-95) 139/83 mmHg (01/21 1800) SpO2:  [89 %-99 %] 99 % (01/21 1800) Weight:  [82.237 kg (181 lb 4.8 oz)-85.322 kg (188 lb 1.6 oz)] 82.237 kg (181 lb 4.8 oz) (01/21 1528)  150 in the last 3 hours.  Physical Exam:   General:  NAD, Alert HEENT:  Right eye deviating laterally Abdomen:  Dressing clean, Fundus firm EXT:  SCDs on.   Recent Labs  12/02/13 1725  HGB 12.1  HCT 34.6*    Assessment/Plan: Status post Cesarean section. Doing well postoperatively. Mild preeclampsia on Magnesium sulfate.  No signs of magnesium toxicity but UOP is decreased so will monitor closely. Repeat preeclampsia labs in the am with magnesium level. On Cytotec to reduce bleeding. Continue routine post op care.  Thurnell Lose 12/03/2013, 6:19 PM

## 2013-12-03 NOTE — Lactation Note (Signed)
This note was copied from the chart of [place]. Lactation Consultation Note  Patient Name: Emily Roman Date: 12/03/2013 Reason for consult: Initial assessment; LPTI 36 weeks  Mom in PACU at time of [place] visit.  LPTI 36 weeks weighing 4 lbs, 9.5 oz.  Infant skin-to-skin with mom.  Nursery RN had attempted to get the baby latched and reported the infant took a few sucks from the right breast.  Right nipple is flat and left is inverted.  RN reported infant's blood sugar was 63, resp - 58, and temp 97.4.  Taught hand expression to mom and importance of hand-expressing; glistening of colostrum noted on nipple was able to obtain tiny drop via curved tip syringe at breast and gave to infant.  Started a #20 nipple shield on left nipple and attempted to get infant latched, but could not get infant to suck.  Infant did hold nipple shield in mouth.  Taught mom and dad how to apply nipple shield, spoon feed and how to use the curved tip syringe at breast for future feedings.  Spoke with RN in AICU to set mom up with DEBP and show how to use preemie setting.  Lactation brochure given and informed of community/ hospital support groups and outpatient services.  Encouraged to call for assistance with feedings.  Will follow-up with at a later time.     Maternal Data Formula Feeding for Exclusion: Yes Reason for exclusion: Mother's choice to formula and breast feed on admission Infant to breast within first hour of birth: Yes Has patient been taught Hand Expression?: Yes Does the patient have breastfeeding experience prior to this delivery?: No  Feeding Feeding Type: Breast Fed  LATCH Score/Interventions Latch: Too sleepy or reluctant, no latch achieved, no sucking elicited. Intervention(s): Teach feeding cues;Waking techniques;Skin to skin  Audible Swallowing: None  Type of Nipple: Inverted (right flat; left inverted) Intervention(s):  (#20 nipple shield)  Comfort (Breast/Nipple):  Soft / non-tender     Hold (Positioning): Assistance needed to correctly position infant at breast and maintain latch. Intervention(s): Breastfeeding basics reviewed;Support Pillows;Skin to skin  LATCH Score: 3  Lactation Tools Discussed/Used Tools: Nipple Shields Nipple shield size: 20 WIC Program: No   Consult Status Consult Status: Follow-up Date: 12/04/13 Follow-up type: In-patient    Merlene Laughter 12/03/2013, 3:39 PM

## 2013-12-03 NOTE — Consult Note (Signed)
Maternal Fetal Medicine Consultation  Requesting Provider(s): Jilda Panda, MD  Reason for consultation: Preeclampsia, recommendations for timing of delivery  HPI: Emily Roman is a 33 yo G2P0100 currently at 88 1/7 weeks, EDD 12/30/2013 who was admitted due to labile blood pressures and proteinuria that were noted on urine dip.  Patient had a recent 24-hr urine protein of 459 mg/24 hrs.  Since admission, her blood pressures have been labile - frequent readings in the 150/90 range.  Her preeclampsia labs have otherwise been within normal limits.  She reports a "low grade" headache, but denies RUQ pain or scotomata.  The fetus is active.  The patient's POB history is remarkable for a prior 16 week loss.  During that pregnancy, she had multiple uterine fibroids that may have played a role in the pregnancy loss.  Since that time, she underwent myomectomy - not felt to be a candidate for vaginal delivery and was tentatively scheduled for C-section at 37 weeks.  OB History: OB History   Grav Para Term Preterm Abortions TAB SAB Ect Mult Living   2 1 0 0 0 0 0 0 0 0       PMH:  Past Medical History  Diagnosis Date  . Wolff-Parkinson-White (WPW) syndrome 2007    ablation done- no further probs    PSH:  Past Surgical History  Procedure Laterality Date  . Eye surgery    . Ablation  2007    cardiac ablation  . Laparoscopy N/A 01/03/2013    Procedure: LAPAROSCOPY OPERATIVE;  Surgeon: Governor Specking, MD;  Location: [facility];  Service: Gynecology;  Laterality: N/A;  gel port assist myomyectomy. Enterolysis and repair of cecum Serosa.    . Laparoscopic lysis of adhesions N/A 01/03/2013    Procedure: LAPAROSCOPIC LYSIS OF ADHESIONS;  Surgeon: Governor Specking, MD;  Location: [facility];  Service: Gynecology;  Laterality: N/A;   Meds:  Scheduled Meds: .  ceFAZolin (ANCEF) IV  2 g Intravenous On Call to OR  . citric acid-sodium citrate      . docusate sodium  100 mg Oral Daily  . prenatal  multivitamin  1 tablet Oral Q1200   Continuous Infusions: . dextrose 5% lactated ringers 125 mL/hr at 12/03/13 1100   PRN Meds:.acetaminophen, calcium carbonate, zolpidem  Allergies:  Allergies  Allergen Reactions  . Shellfish Allergy Anaphylaxis   FH: denies family history of birth defects or hereditary disorders.  Both parents with Chronic hypertension.  Grandmother with kidney disorder (polycystic kidneys?)  Soc:  History  Smoking status  . Never Smoker   Smokeless tobacco  . Not on file   History  Alcohol Use No    Review of Systems: no vaginal bleeding or cramping/contractions, no LOF, no nausea/vomiting. All other systems reviewed and are negative.  PE:   Filed Vitals:   12/03/13 0733  BP: 147/90  Pulse: 88  Temp: 98.2 F (36.8 C)  Resp: 18    GEN: well-appearing female ABD: gravid, NT  Ultrasound: Single IUP at 36 1/7 weeks Preeclampsia The estimated fetal weight today is < 10th%tile with the AC < 3rd %tile. UA Dopplers at the 90th %tile for gestational age.  No absent or reversed diastolic flow is noted BPP 8/8 Normal amniotic fluid volume Multiple uterine myomas noted  Labs: CBC    Component Value Date/Time   WBC 7.1 12/02/2013 1725   RBC 3.83* 12/02/2013 1725   HGB 12.1 12/02/2013 1725   HCT 34.6* 12/02/2013 1725   PLT 162 12/02/2013 1725  MCV 90.3 12/02/2013 1725   MCH 31.6 12/02/2013 1725   MCHC 35.0 12/02/2013 1725   RDW 12.8 12/02/2013 1725   LYMPHSABS 2.7 06/06/2013 1904   MONOABS 0.5 06/06/2013 1904   EOSABS 0.2 06/06/2013 1904   BASOSABS 0.0 06/06/2013 1904   CMP     Component Value Date/Time   NA 139 12/02/2013 1725   K 3.6* 12/02/2013 1725   CL 104 12/02/2013 1725   CO2 21 12/02/2013 1725   GLUCOSE 106* 12/02/2013 1725   BUN 11 12/02/2013 1725   CREATININE 0.66 12/02/2013 1725   CALCIUM 9.2 12/02/2013 1725   PROT 5.7* 12/02/2013 1725   ALBUMIN 2.4* 12/02/2013 1725   AST 15 12/02/2013 1725   ALT 11 12/02/2013 1725   ALKPHOS 130* 12/02/2013  1725   BILITOT 0.4 12/02/2013 1725   GFRNONAA >90 12/02/2013 1725   GFRAA >90 12/02/2013 1725      A/P: 1) Single IUP at 36 1/7 weeks         2) Suspected fetal growth restriction (< 10th %tile with AC < 3rd %tile, elevated UA Doppler studies)         3) Preeclampsia without severe features         4) Hx of prior myomectomy, not felt to be a candidate for trial of labor  Recommendations - 1) Although suspected fetal growth restriction is not longer used to diagnose preeclampsia with severe features, given the degree of fetal growth restriction with elevated UA Dopplers, would recommend moving toward delivery.  The patient is not a candidate for trial of labor - would recommend cesarean delivery 2) Would recommend magnesium sulfate prophylaxis for 24-hrs post partum and would consider starting it prior to delivery if there is any delay in moving toward cesarean delivery   Thank you for the opportunity to be a part of the care of [employer]. Please contact our office if we can be of further assistance.   I spent approximately 30 minutes with this patient with over 50% of time spent in face-to-face counseling.  Benjaman Lobe, MD Maternal Fetal Medicine

## 2013-12-03 NOTE — Anesthesia Preprocedure Evaluation (Signed)
Anesthesia Evaluation  Patient identified by MRN, date of birth, ID band Patient awake    Reviewed: Allergy & Precautions, H&P , NPO status , Patient's Chart, lab work & pertinent test results  Airway Mallampati: II TM Distance: >3 FB Neck ROM: Full    Dental no notable dental hx. (+) Teeth Intact   Pulmonary neg pulmonary ROS,  breath sounds clear to auscultation  Pulmonary exam normal       Cardiovascular hypertension, + dysrhythmias Rhythm:Regular Rate:Normal  Mild pre eclampsia Hx/o WPW S/P ablation in  2007   Neuro/Psych negative neurological ROS  negative psych ROS   GI/Hepatic Neg liver ROS, GERD-  Medicated and Controlled,  Endo/Other  negative endocrine ROS  Renal/GU negative Renal ROS  negative genitourinary   Musculoskeletal   Abdominal   Peds  Hematology negative hematology ROS (+)   Anesthesia Other Findings   Reproductive/Obstetrics (+) Pregnancy                           Anesthesia Physical Anesthesia Plan  ASA: II  Anesthesia Plan: Spinal   Post-op Pain Management:    Induction:   Airway Management Planned: Natural Airway  Additional Equipment:   Intra-op Plan:   Post-operative Plan:   Informed Consent: I have reviewed the patients History and Physical, chart, labs and discussed the procedure including the risks, benefits and alternatives for the proposed anesthesia with the patient or authorized representative who has indicated his/her understanding and acceptance.     Plan Discussed with: Anesthesiologist, CRNA and Surgeon  Anesthesia Plan Comments:         Anesthesia Quick Evaluation

## 2013-12-03 NOTE — Anesthesia Postprocedure Evaluation (Signed)
  Anesthesia Post-op Note  Patient: Emily Roman  Procedure(s) Performed: Procedure(s): CESAREAN SECTION (N/A)  Patient Location: PACU  Anesthesia Type:Spinal  Level of Consciousness: awake, alert  and oriented  Airway and Oxygen Therapy: Patient Spontanous Breathing  Post-op Pain: mild  Post-op Assessment: Post-op Vital signs reviewed, Patient's Cardiovascular Status Stable, Respiratory Function Stable, Patent Airway, No signs of Nausea or vomiting, Pain level controlled, No headache, No backache and No residual motor weakness  Post-op Vital Signs: Reviewed and stable  Complications: No apparent anesthesia complications

## 2013-12-03 NOTE — Progress Notes (Signed)
Subjective: Spoke with Dr. Benjaman Lobe, MFM.  States pt ultrasound this morning shows EFW less than the 10%, head less than the 5th.  Doppler studies are normal.  Given the diagnosis of preeclampsia, previous myomectomy and now growth restriction proceeding with delivery is acceptable.  He discussed this with pt at length.    Patient is without complaints.  Had dull headache overnight.  Denies visual changes, headaches or RUQ pain.  Denies contractions, LOF.  Pt verbalizes understandings of recommendations and rationale for delivery at this time instead of scheduled cesarean section next week.  Pt had preop 2 days ago and questions were answered.   Objective: Vital signs in last 24 hours: Temp:  [97.5 F (36.4 C)-98.4 F (36.9 C)] 98 F (36.7 C) (01/21 0447) Pulse Rate:  [69-97] 88 (01/21 0733) Resp:  [16-20] 18 (01/21 0447) BP: (132-151)/(78-95) 147/90 mmHg (01/21 0733) Weight:  [85.322 kg (188 lb 1.6 oz)-85.73 kg (189 lb)] 85.322 kg (188 lb 1.6 oz) (01/21 0839)  Physical Exam:  General: alert, cooperative and no distress CV:  RRR Lungs:  CTA bilateral Abdomen:  Nontender Neuro:  Not illicited on lower extremities. DVT Evaluation: No evidence of DVT seen on physical exam. SCDs off.  2+ pitting edema   EM:  Reactive, no contractions. Ultrasound results pending.  Verbal report received from Dr. Lisbeth Renshaw.   Recent Labs  12/02/13 1725  HGB 12.1  HCT 34.6*   Preeclampsia labs normal and stable.   Assessment/Plan: IUP at 36 1/7 weeks with mild Preeclampsia and now IUGR-BPs increased from previous but still mild.  Trending up. H/o previous Myomectomy.  Proceed with primary cesarean section.  Pt had preop app 2 days ago so questions answered.  Risks of bleeding, emergency hysterectomy due to accreta were reviewed again.  All questions answered.  Thurnell Lose 12/03/2013, 8:49 AM

## 2013-12-03 NOTE — Progress Notes (Signed)
Dr Margie Billet called in to check on urine output of patient. MD questioned RN for a more accurate I/O. Urine meter will be placed. RN notified MD that pitocin was still infusing. Ordered to discontinue and start LR. Will continue to monitor.

## 2013-12-04 ENCOUNTER — Encounter (HOSPITAL_COMMUNITY): Payer: Self-pay | Admitting: Obstetrics and Gynecology

## 2013-12-04 LAB — COMPREHENSIVE METABOLIC PANEL
ALBUMIN: 2.2 g/dL — AB (ref 3.5–5.2)
ALK PHOS: 113 U/L (ref 39–117)
ALT: 11 U/L (ref 0–35)
AST: 16 U/L (ref 0–37)
BILIRUBIN TOTAL: 0.4 mg/dL (ref 0.3–1.2)
BUN: 9 mg/dL (ref 6–23)
CHLORIDE: 100 meq/L (ref 96–112)
CO2: 22 meq/L (ref 19–32)
CREATININE: 0.62 mg/dL (ref 0.50–1.10)
Calcium: 8.2 mg/dL — ABNORMAL LOW (ref 8.4–10.5)
GLUCOSE: 91 mg/dL (ref 70–99)
POTASSIUM: 4 meq/L (ref 3.7–5.3)
Sodium: 135 mEq/L — ABNORMAL LOW (ref 137–147)
Total Protein: 5.5 g/dL — ABNORMAL LOW (ref 6.0–8.3)

## 2013-12-04 LAB — CBC
HCT: 31.2 % — ABNORMAL LOW (ref 36.0–46.0)
HEMOGLOBIN: 11 g/dL — AB (ref 12.0–15.0)
MCH: 31.8 pg (ref 26.0–34.0)
MCHC: 35.3 g/dL (ref 30.0–36.0)
MCV: 90.2 fL (ref 78.0–100.0)
Platelets: 161 10*3/uL (ref 150–400)
RBC: 3.46 MIL/uL — AB (ref 3.87–5.11)
RDW: 12.9 % (ref 11.5–15.5)
WBC: 10 10*3/uL (ref 4.0–10.5)

## 2013-12-04 LAB — MAGNESIUM
MAGNESIUM: 4.5 mg/dL — AB (ref 1.5–2.5)
Magnesium: 4.2 mg/dL — ABNORMAL HIGH (ref 1.5–2.5)

## 2013-12-04 NOTE — Lactation Note (Signed)
This note was copied from the chart of [place]. Lactation Consultation Note    Follow up consult with this mom of a late pre term baby, now 8 hours old, and 36 2/7 weeks corrected gestatiin. The baby is small, weighing 4 1/2 pounds. He has a strong suck, but no colostrum seen in nipple shield. He unlatched after 5 minutes. Mom is doing skin to skin for 30 minutes, then is going to pump, and I will assist her with hadn expression.   Patient Name: Boy Graciana Sessa [id number] Date: 12/04/2013 Reason for consult: Follow-up assessment;Infant < 6lbs;Late preterm infant   Maternal Data    Feeding Feeding Type: Breast Fed Length of feed: 30 min  LATCH Score/Interventions Latch: Repeated attempts needed to sustain latch, nipple held in mouth throughout feeding, stimulation needed to elicit sucking reflex. Intervention(s): Skin to skin;Teach feeding cues;Waking techniques Intervention(s): Adjust position;Assist with latch  Audible Swallowing: None  Type of Nipple: Flat (mom applied 20 nipple shiled)  Comfort (Breast/Nipple): Soft / non-tender     Hold (Positioning): Assistance needed to correctly position infant at breast and maintain latch. Intervention(s): Breastfeeding basics reviewed;Support Pillows;Position options;Skin to skin  LATCH Score: 5  Lactation Tools Discussed/Used Nipple shield size: 20   Consult Status Consult Status: Follow-up Date: 12/04/13 Follow-up type: In-patient    Tonna Corner 12/04/2013, 10:29 AM

## 2013-12-04 NOTE — Op Note (Signed)
Dictated in EPIC.  #[id number]

## 2013-12-04 NOTE — Lactation Note (Signed)
This note was copied from the chart of [place]. Lactation Consultation Note     Follow up consult with this mom  Of a small baby, 4-7, 36 2/7 weeks, and 24 hours old. He had 1 void and 3 tolls in first 24 hours, and just had a fourth black meconium stool. He was rooting, would latch, one -two suckles, and then asleep. Mom pumped in premie setting, and after I was able to express 2 drops of colostrum , which was cup/finger fed to baby. He then was bottle fed Neosure 22 cal formula. It took him about 15 minutes to begin sucking, with intermittent burping prior to this. He took 6 mls of formula, and mom is now holding him upright on her chest. Mom knows to call for lactation assistance as needed. Mom still on magnesium drip, but getting OOB to care for baby. Mom already has a Medela DEP at home.   Patient Name: Emily Roman [id number] Date: 12/04/2013 Reason for consult: Follow-up assessment;Infant < 6lbs   Maternal Data    Feeding Feeding Type: Breast Milk  LATCH Score/Interventions Latch: Repeated attempts needed to sustain latch, nipple held in mouth throughout feeding, stimulation needed to elicit sucking reflex. Intervention(s): Skin to skin;Teach feeding cues;Waking techniques Intervention(s): Adjust position;Assist with latch  Audible Swallowing: None  Type of Nipple: Flat (mom applied 20 nipple shiled)  Comfort (Breast/Nipple): Soft / non-tender     Hold (Positioning): Assistance needed to correctly position infant at breast and maintain latch. Intervention(s): Breastfeeding basics reviewed;Support Pillows;Position options;Skin to skin  LATCH Score: 5  Lactation Tools Discussed/Used Nipple shield size: 20   Consult Status Consult Status: Follow-up Date: 12/05/13 Follow-up type: In-patient    Tonna Corner 12/04/2013, 12:33 PM

## 2013-12-04 NOTE — Progress Notes (Signed)
Postoperative Note Day # 1  S:  Patient resting comfortable in bed.  Pain well controlled.  Tolerating general diet. No flatus, no BM.  No ambulation, but pt to bedside standing without problems. Pt has foley in place. She denies n/v/f/c, SOB, or CP.  No headache, no blurry vision, no RUQ pain.  Breastfeeding without difficulties  O: BP 141/73  Pulse 104  Temp(Src) 97.9 F (36.6 C) (Oral)  Resp 20  Ht 5\' 7"  (1.702 m)  Wt 82.237 kg (181 lb 4.8 oz)  BMI 28.39 kg/m2  SpO2 96%  LMP 03/25/2013  Tmax: 98.4 HR range: 74-104 (104 outlier) BP range: 669 114 5512 UOP: 730cc/4hr  Gen: A&Ox3, NAD CV: RRR, no MRG Resp: CTAB Abdomen: soft, minimal distension, no rebound, no guarding, BS quiet Uterus: firm, non-tender, below umbilicus Incision: bandage-pressure dressing in place- clean & dry Lochia: moderate Ext: 1+ pitting pedal edema, no calf tenderness bilaterally, SCDs in place.  No clonus, DTR 1+ bilaterally  Labs:  CBC    Component Value Date/Time   WBC 10.0 12/04/2013 0525   RBC 3.46* 12/04/2013 0525   HGB 11.0* 12/04/2013 0525   HCT 31.2* 12/04/2013 0525   PLT 161 12/04/2013 0525   MCV 90.2 12/04/2013 0525   MCH 31.8 12/04/2013 0525   MCHC 35.3 12/04/2013 0525   RDW 12.9 12/04/2013 0525   LYMPHSABS 2.7 06/06/2013 1904   MONOABS 0.5 06/06/2013 1904   EOSABS 0.2 06/06/2013 1904   BASOSABS 0.0 06/06/2013 1904   BUN/Cr: 9/0.62 AST/ALT: 16/11 Mag: 4.5  A/P: Pt is a 33 y.o. [id number] s/p Primary LTCS due to prior myomectomy, preeclampsia and IUGR.  POD #1  -Preeclampsia  BP range as above, no medications needed.  Pt to complete 24hr of Magnesium post-delivery, to be discontinued around 1240 today.  Once pt stable off magnesium will transfer out of AICU this afternoon  Pt is currently asymptomatic, examination and labs stable as above.  UOP improving this am -Pain: well controlled -GU: Will discontinue foley once pt able to ambulate, UOP adequate -GI: Continue general diet. -Activity:  encouraged sitting up to chair and ambulation as tolerated  -Prophylaxis: SCDs while in bed, early ambulation -Breastfeeding without difficulties -Baby boy circ to be done by Dr. Simona Huh tomorrow  Janyth Pupa, DO [phone] (pager) [phone] (office)

## 2013-12-04 NOTE — Progress Notes (Signed)
pts pressure dsg was removed to change old bloody saturated steris and honeycomb dsg underneath. Incision was well approximated with underlying sutures. No new drainage evident. New steris and a new honeycomb dsg were applied. Pt. tol procedure well.

## 2013-12-05 MED ORDER — METHYLERGONOVINE MALEATE 0.2 MG/ML IJ SOLN: 0.2000 mg | INTRAMUSCULAR | Status: DC | PRN

## 2013-12-05 MED ORDER — ONDANSETRON HCL 4 MG/2ML IJ SOLN: 4.0000 mg | INTRAMUSCULAR | Status: DC | PRN

## 2013-12-05 MED ORDER — DIPHENHYDRAMINE HCL 25 MG PO CAPS: 25.0000 mg | ORAL_CAPSULE | Freq: Four times a day (QID) | ORAL | Status: DC | PRN

## 2013-12-05 MED ORDER — IBUPROFEN 600 MG PO TABS
600.0000 mg | ORAL_TABLET | Freq: Four times a day (QID) | ORAL | Status: DC
  Administered 2013-12-06 (×2): 600 mg via ORAL
  Filled 2013-12-05 (×2): qty 1

## 2013-12-05 MED ORDER — WITCH HAZEL-GLYCERIN EX PADS: 1.0000 "application " | MEDICATED_PAD | CUTANEOUS | Status: DC | PRN

## 2013-12-05 MED ORDER — ONDANSETRON HCL 4 MG PO TABS: 4.0000 mg | ORAL_TABLET | ORAL | Status: DC | PRN

## 2013-12-05 MED ORDER — PRENATAL MULTIVITAMIN CH: 1.0000 | ORAL_TABLET | Freq: Every day | ORAL | Status: DC

## 2013-12-05 MED ORDER — DIBUCAINE 1 % RE OINT: 1.0000 "application " | TOPICAL_OINTMENT | RECTAL | Status: DC | PRN

## 2013-12-05 MED ORDER — SIMETHICONE 80 MG PO CHEW: 80.0000 mg | CHEWABLE_TABLET | ORAL | Status: DC | PRN

## 2013-12-05 MED ORDER — MAGNESIUM HYDROXIDE 400 MG/5ML PO SUSP: 30.0000 mL | ORAL | Status: DC | PRN

## 2013-12-05 MED ORDER — BENZOCAINE-MENTHOL 20-0.5 % EX AERO: 1.0000 "application " | INHALATION_SPRAY | CUTANEOUS | Status: DC | PRN

## 2013-12-05 MED ORDER — SENNOSIDES-DOCUSATE SODIUM 8.6-50 MG PO TABS: 2.0000 | ORAL_TABLET | ORAL | Status: DC

## 2013-12-05 MED ORDER — METHYLERGONOVINE MALEATE 0.2 MG PO TABS: 0.2000 mg | ORAL_TABLET | ORAL | Status: DC | PRN

## 2013-12-05 MED ORDER — OXYCODONE-ACETAMINOPHEN 5-325 MG PO TABS: 1.0000 | ORAL_TABLET | ORAL | Status: DC | PRN

## 2013-12-05 MED ORDER — TETANUS-DIPHTH-ACELL PERTUSSIS 5-2.5-18.5 LF-MCG/0.5 IM SUSP: 0.5000 mL | Freq: Once | INTRAMUSCULAR | Status: DC

## 2013-12-05 MED ORDER — OXYTOCIN 40 UNITS IN LACTATED RINGERS INFUSION - SIMPLE MED: 62.5000 mL/h | INTRAVENOUS | Status: DC | PRN

## 2013-12-05 MED ORDER — LANOLIN HYDROUS EX OINT: TOPICAL_OINTMENT | CUTANEOUS | Status: DC | PRN

## 2013-12-05 MED ORDER — ZOLPIDEM TARTRATE 5 MG PO TABS: 5.0000 mg | ORAL_TABLET | Freq: Every evening | ORAL | Status: DC | PRN

## 2013-12-05 MED ORDER — MEASLES, MUMPS & RUBELLA VAC ~~LOC~~ INJ
0.5000 mL | INJECTION | Freq: Once | SUBCUTANEOUS | Status: DC
  Filled 2013-12-05: qty 0.5

## 2013-12-05 NOTE — Progress Notes (Signed)
Subjective: s/p: Cesarean Delivery Patient reports incisional pain.  Ambulating.  Pain controlled.  Denies headaches, visual changes RUQ pain.  Objective: Vital signs in last 24 hours: Temp:  [97.9 F (36.6 C)-98.4 F (36.9 C)] 98 F (36.7 C) (01/23 1330) Pulse Rate:  [76-98] 93 (01/23 1330) Resp:  [18] 18 (01/23 1330) BP: (120-146)/(70-86) 136/86 mmHg (01/23 1330) SpO2:  [96 %-97 %] 97 % (01/23 1330)  Physical Exam:  General: alert, cooperative and no distress Lochia: appropriate Uterine Fundus: firm Incision: Dressing clean and dry. DVT Evaluation: No evidence of DVT seen on physical exam. Calf/Ankle edema is present.   Recent Labs  12/04/13 0525  HGB 11.0*  HCT 31.2*    Assessment/Plan: Status post Cesarean section. Doing well postoperatively.  Mild preeclampsia on Magnesium sulfate.  No s/sxs of toxicity UOP good. Continue current care.  Thurnell Lose 12/05/2013, 5:49 PM

## 2013-12-05 NOTE — Lactation Note (Signed)
This note was copied from the chart of [place]. Lactation Consultation Note  Patient Name: Emily Roman Date: 12/05/2013 Reason for consult: Follow-up assessment Infant 10 hours old, late pre-termer ([redacted]w[redacted]d), weighing 4lb 5 oz. Mom states that she is about to offer the baby the breast with nipple shield. States that she has not had any colostrum yet. Assisted mom to latch baby with NS on left breast. Baby nursed well for 10 minutes. Enc mom to limit baby at breast, no more than 15 minutes, and watch for signs that the baby may want to stop earlier. Assisted mom to hand express and post pump while father of baby fed a bottle with formula. Mom began to drip colostrum. Plan is for mom to massage and hand express prior to offering the baby the breast. Limit the time the baby is at the breast, then massage and hand express both breast, DEBP on preemie setting, then post massage and hand express. Dad can feed the baby colostrum first, and follow the feeding chart to offer the balance of formula. Mom and dad enc to feed with cues, but not to let baby go beyond 3 hours before feeding again. Mom enc to call out for assistance as needed.   Maternal Data    Feeding Feeding Type: Bottle Fed - Formula Length of feed: 10 min  LATCH Score/Interventions Latch: Grasps breast easily, tongue down, lips flanged, rhythmical sucking. Intervention(s): Adjust position;Assist with latch  Audible Swallowing: None Intervention(s): Skin to skin;Hand expression  Type of Nipple: Flat (Nipple shield.) Intervention(s): Double electric pump  Comfort (Breast/Nipple): Soft / non-tender     Hold (Positioning): Assistance needed to correctly position infant at breast and maintain latch. (taught how to do the football hold.) Intervention(s): Breastfeeding basics reviewed;Support Pillows;Position options  LATCH Score: 6  Lactation Tools Discussed/Used Tools: Nipple Shields Nipple shield size:  20   Consult Status Consult Status: PRN Date: 12/06/13 Follow-up type: In-patient    Inocente Salles 12/05/2013, 3:52 PM

## 2013-12-05 NOTE — Op Note (Signed)
Emily Roman, Emily Roman               ACCOUNT NO.:  [id number]  MEDICAL RECORD NO.:  [id number]  LOCATION:  1093                          FACILITY:  [facility]  PHYSICIAN:  Jola Schmidt, MD   DATE OF BIRTH:  August 31, 1981  DATE OF PROCEDURE:  12/03/2013 DATE OF DISCHARGE:                              OPERATIVE REPORT   PREOPERATIVE DIAGNOSES:  Intrauterine pregnancy at 72 and 1/7th weeks, mild preeclampsia, now with intrauterine growth restriction, previous myomectomy, and uterine fibroids.  POSTOPERATIVE DIAGNOSES:  Intrauterine pregnancy at 51 and 1/7th weeks, mild preeclampsia, now with intrauterine growth restriction, previous myomectomy, and uterine fibroids.  SURGEON:  Jola Schmidt, MD  ASSISTANT:  Dr. Angelia Mould.  ANESTHESIA:  Spinal.  ESTIMATED BLOOD LOSS:  600 mL.  IV FLUIDS:  Per anesthesia.  URINE OUTPUT:  100 mL of clear urine.  COMPLICATIONS:  None.  SPECIMENS:  Placenta to Pathology.  DISPOSITION:  To PACU, hemodynamically stable.  FINDINGS:  Viable female infant, vertex position, clear fluid noted.  No nuchal cord.  Apgars 8 and 9.  Weight was 4 pounds 9 ounces.  Multiple uterine fibroids, largest approximately 7 cm, anteriorly near the left cornu, several 3 cm fibroids above the hysterotomy incision.  No significant adhesions of the bowel to the uterus.  Bladder was not adhesed below uterine segment.  PROCEDURE IN DETAIL:  Ms. Emily Roman [id number] was admitted yesterday for observation for elevated blood pressure after a protein creatinine urine revealed 2.4.  Overnight her blood pressure,  which was previously high normal progressed to mildly elevated.  MFM consult was performed and an ultrasound was done, which showed a vertex fetus that was growth restricted, less than 10th percentile.  AC was less than the 3rd percentile and amniotic fluid was normal.  Fetal Dopplers were normal.  Due to growth restriction in the setting of preeclampsia and history of a  previous myomectomy, it was recommended to proceed with delivery via primary C-section.  The patient was counseled on her diagnosis and the rationale of the procedure, and informed consent was obtained.  The patient was taken to the operating room, where she underwent spinal anesthesia without complication.  She was then placed in the dorsal supine position with a leftward tilt.  A Foley catheter was placed by me with sterile technique.  She was then prepped and draped in the normal sterile fashion.  The abdomen was then marked for Pfannenstiel skin incision and started 2 cm above the symphysis pubis.  Incision was made with scalpel and carried down to the underlying layer of the fascia with the Bovie.  The fascia was then incised at the midline and extended laterally with the curved Mayo scissors.  The rectus muscles were dissected sharply off the fascia above and below.  Abdominal muscles were then separated bluntly and sharply with the curved Mayo scissors.  The peritoneum was identified and tented and entered sharply with the Metzenbaum scissors and the peritoneal cavity was stretched.  The uterus was then palpated to make sure there were no adhesions.  The Alexis retractor was then advanced through the abdomen.  There was a 7-to 8-cm fibroid that was almost the shape of  a doorknob.  The Alexis retractor was placed over Rim of the  [place] retractor was placed over that fibroid.  Bladder flap was developed.  It was slightly adhesed, but did not tear.  The bladder was reflected down.  Then with a scalpel, a transverse incision was made in the lower uterine segment and extended with the bandage scissors.  There was an uterine sinus on the right-hand side and for that reason, I did not go completely transverse just avoid that sinus.  Once got down to the amnion, the bag was ruptured with the Allis clamp and clear fluid was noted.  Attempted to deliver the head with fundal pressure,  however, I needed to extend the hysterotomy incision.  Once that was done, the head delivered easily.  Nose and mouth were suctioned.  Shoulders were delivered easily and body followed.  Cord was clamped x2 and cut and baby was passed off to the awaiting NICU team.  Cord blood was obtained.  The placenta was then delivered easily.  The patient had previously been counseled about Accreta and was informed that the placenta was removed intact without any issue.  The uterus was cleared of all clots and debris.  There was some cervical mucus that initially thought were membranes, but decided it was mucus.  The hysterotomy incision was then reapproximated with 0 chromic in a running locked fashion.  A second layer of the same suture was used for imbrication.  The apex of the incisions were sutured with tag.  The gutters were then copiously irrigated to clear.  Interceed was then applied to the lower uterine segment.  The peritoneum was then reapproximated with 2-0 Vicryl in a continuous running fashion.  The rectus muscles were then inspected for any bleeding and cauterized as needed with the Bovie.  The fascia was then reapproximated with 0 Vicryl in a continuous running fashion.  Kocher clamp was used to grasp the edge of the incision and the subcutaneous space was copiously irrigated and cauterized with the Bovie as needed and reapproximated with 2-0 plain gut.  The skin was then reapproximated with 4-0 Vicryl on a Keith needle.  The patient did have some oozing on the apex of the left and right side that seemed to respond to tamponade. The benzoin was applied with Steri-Strips and a pressure dressing was applied.  Pressure was held until the dressing was in place.  There was no active bleeding at that time.  Going back to the hysterotomy incision, the hysterotomy edges were grasped with ring forceps.  I did suture through one of the ring forceps and almost on the first layer and almost  at the end of the closure.  The suture going through the ring portion was cut and tied off, and I continued to close the remaining incision without any issue. Hemostasis was noted.  All instruments, sponge, and needle counts were correct x3.  The patient was taken to the Operating Room with IV running.  She received Ancef 2 g IV prior to the procedure.  She had SCDs on throughout the entire case. The baby was 4 pounds 9 ounces which was weighed in the OR suite, but keeping O2 saturations up so the baby was allowed to remain with mom until the end of the case.     Jola Schmidt, MD     EBV/MEDQ  D:  12/04/2013  T:  12/05/2013  Job:  [id number]

## 2013-12-06 ENCOUNTER — Ambulatory Visit: Payer: Self-pay

## 2013-12-06 DIAGNOSIS — Z98891 History of uterine scar from previous surgery: Secondary | ICD-10-CM

## 2013-12-06 LAB — CBC
HEMATOCRIT: 35.1 % — AB (ref 36.0–46.0)
Hemoglobin: 11.8 g/dL — ABNORMAL LOW (ref 12.0–15.0)
MCH: 31.5 pg (ref 26.0–34.0)
MCHC: 33.6 g/dL (ref 30.0–36.0)
MCV: 93.6 fL (ref 78.0–100.0)
Platelets: 229 10*3/uL (ref 150–400)
RBC: 3.75 MIL/uL — ABNORMAL LOW (ref 3.87–5.11)
RDW: 13.2 % (ref 11.5–15.5)
WBC: 10.9 10*3/uL — ABNORMAL HIGH (ref 4.0–10.5)

## 2013-12-06 LAB — TYPE AND SCREEN
ABO/RH(D): O POS
ANTIBODY SCREEN: NEGATIVE
UNIT DIVISION: 0
Unit division: 0

## 2013-12-06 MED ORDER — OXYCODONE-ACETAMINOPHEN 5-325 MG PO TABS: 1.0000 | ORAL_TABLET | ORAL | Status: DC | PRN

## 2013-12-06 MED ORDER — IBUPROFEN 600 MG PO TABS: 600.0000 mg | ORAL_TABLET | Freq: Four times a day (QID) | ORAL | Status: DC | PRN

## 2013-12-06 NOTE — Discharge Instructions (Signed)
Postpartum Care After Cesarean Delivery °After you deliver your newborn (postpartum period), the usual stay in the hospital is 24 72 hours. If there were problems with your labor or delivery, or if you have other medical problems, you might be in the hospital longer.  °While you are in the hospital, you will receive help and instructions on how to care for yourself and your newborn during the postpartum period.  °While you are in the hospital: °· It is normal for you to have pain or discomfort from the incision in your abdomen. Be sure to tell your nurses when you are having pain, where the pain is located, and what makes the pain worse. °· If you are breastfeeding, you may feel uncomfortable contractions of your uterus for a couple of weeks. This is normal. The contractions help your uterus get back to normal size. °· It is normal to have some bleeding after delivery. °· For the first 1 3 days after delivery, the flow is red and the amount may be similar to a period. °· It is common for the flow to start and stop. °· In the first few days, you may pass some small clots. Let your nurses know if you begin to pass large clots or your flow increases. °· Do not  flush blood clots down the toilet before having the nurse look at them. °· During the next 3 10 days after delivery, your flow should become more watery and pink or brown-tinged in color. °· Ten to fourteen days after delivery, your flow should be a small amount of yellowish-white discharge. °· The amount of your flow will decrease over the first few weeks after delivery. Your flow may stop in 6 8 weeks. Most women have had their flow stop by 12 weeks after delivery. °· You should change your sanitary pads frequently. °· Wash your hands thoroughly with soap and water for at least 20 seconds after changing pads, using the toilet, or before holding or feeding your newborn. °· Your intravenous (IV) tubing will be removed when you are drinking enough fluids. °· The  urine drainage tube (urinary catheter) that was inserted before delivery may be removed within 6 8 hours after delivery or when feeling returns to your legs. You should feel like you need to empty your bladder within the first 6 8 hours after the catheter has been removed. °· In case you become weak, lightheaded, or faint, call your nurse before you get out of bed for the first time and before you take a shower for the first time. °· Within the first few days after delivery, your breasts may begin to feel tender and full. This is called engorgement. Breast tenderness usually goes away within 48 72 hours after engorgement occurs. You may also notice milk leaking from your breasts. If you are not breastfeeding, do not stimulate your breasts. Breast stimulation can make your breasts produce more milk. °· Spending as much time as possible with your newborn is very important. During this time, you and your newborn can feel close and get to know each other. Having your newborn stay in your room (rooming in) will help to strengthen the bond with your newborn. It will give you time to get to know your newborn and become comfortable caring for your newborn. °· Your hormones change after delivery. Sometimes the hormone changes can temporarily cause you to feel sad or tearful. These feelings should not last more than a few days. If these feelings last longer   than that, you should talk to your caregiver. °· If desired, talk to your caregiver about methods of family planning or contraception. °· Talk to your caregiver about immunizations. Your caregiver may want you to have the following immunizations before leaving the hospital: °· Tetanus, diphtheria, and pertussis (Tdap) or tetanus and diphtheria (Td) immunization. It is very important that you and your family (including grandparents) or others caring for your newborn are up-to-date with the Tdap or Td immunizations. The Tdap or Td immunization can help protect your newborn  from getting ill. °· Rubella immunization. °· Varicella (chickenpox) immunization. °· Influenza immunization. You should receive this annual immunization if you did not receive the immunization during your pregnancy. °Document Released: 07/24/2012 Document Reviewed: 07/24/2012 °[employer]® Patient Information ©2014 [employer]. ° °

## 2013-12-06 NOTE — Lactation Note (Signed)
This note was copied from the chart of [place]. Lactation Consultation Note    Follow up consult with this mom of a ;late pre term baby, now weighing 4 - 4.8, and 36 4/7 weeks corrected gestation. Mom and baby are 6 hours post partum, and mom's milk is transitioning in. Mom has a DEP at home, and is pumping every 3 hours, 15-30 minutres. She is feeding the baby at breast with nipple shiled, adn then supplementing with EBM, then formula, if needed. Formula teaching done with mom and dad. I will see this mom nd baby in o/p lactation on 12/10/13.  Patient Name: Emily Roman [id number] Date: 12/06/2013     Maternal Data    Feeding    LATCH Score/Interventions                      Lactation Tools Discussed/Used     Consult Status      Tonna [place] 12/06/2013, 3:21 PM

## 2013-12-06 NOTE — Discharge Summary (Signed)
Cesarean Section Delivery Discharge Summary  Emily Roman  DOB:    January 21, 1981 MRN:    [id number] CSN:    [id number]  Date of admission:                  12/02/13  Date of discharge:                   12/06/13  Procedures this admission: 12/04/13  Date of Delivery: 12/04/13  Newborn Data:  Live born female  Birth Weight: 4 lb 9.2 oz (2075 g) APGAR: 8, 9  Home with mother.  History of Present Illness:  Emily Roman is a 33 y.o. female, G2P0101, who presents at [redacted]w[redacted]d weeks gestation. The patient has been followed at the [facility] and Gynecology division of [facility].    Her pregnancy has been complicated by:  Patient Active Problem List   Diagnosis Date Noted  . Mild or unspecified pre-eclampsia, with delivery 12/07/2013  . Hx of incompetent cervix, currently pregnant 12/07/2013  . Congenital heart disease--hx wollf white parkinson, s/p ablation 12/07/2013  . Status post primary low transverse cesarean section--hx myomectomy 12/06/2013  . Antepartum mild preeclampsia 12/02/2013  . Seasonal allergies 03/20/2012    Hospital course:  The patient was admitted on 12/02/13 s/p routine OB viist where proteinuria was noted, with PCR 2.4, PIH labs WNL.  BP was 136/80, with 2 lb weight gain in 24 hours.  Hx was remarkable for previous myomectomy, requiring primary C/S for delivery, hx Wollf-White-Parkinson sx, with previous ablation in 2007, and incompetent cervix.   She was admitted for observation, BP monitoring, 24 hour urine collection, and FHR monitoring.  MFM consult was obtained on 1/21, with recommendation made for delivery, due to IUGR and elevated Doppler flow.  A primary LTCS was performed by Dr. Simona Huh, with no complications.  The patient was maintained on magnesium sulfate for 24 hours after delivery, and then was transferred to [facility] for pp care.  After this, her postpartum course was not complicated.  She was discharged to home on  postpartum day 3 doing well.  Feeding:  breast  Contraception:  Will discuss with Dr. Simona Huh at pp visit.  Declines at present.  Discharge hemoglobin:  Hemoglobin  Date Value Range Status  12/06/2013 11.8* 12.0 - 15.0 g/dL Final     HCT  Date Value Range Status  12/06/2013 35.1* 36.0 - 46.0 % Final    Discharge Physical Exam:   General: alert Lochia: appropriate Uterine Fundus: firm Incision: Honeycomb dressing was replaced on day of d/c due to old drainage and loss of adherence of dressing--Incision CDI. DVT Evaluation: No evidence of DVT seen on physical exam. Negative Homan's sign.  Intrapartum Procedures: cesarean: low cervical, transverse Postpartum Procedures: Magnesium sulfate x 24 hours pp  Complications-Operative and Postpartum: none  Discharge Diagnoses: Pre-eclampsia, IUGR, previous myomectomy, primary LTCS  Discharge Information:  Activity:           Per [facility] Diet:                routine Medications: Ibuprofen and Percocet Condition:      stable Instructions:  Care After Cesarean Delivery  Refer to this sheet in the next few weeks. These instructions provide you with information on caring for yourself after your procedure. Your caregiver may also give you specific instructions. Your treatment has been planned according to current medical practices, but problems sometimes occur. Call your caregiver if you have any problems  or questions after you go home. HOME CARE INSTRUCTIONS  Only take over-the-counter or prescription medicines as directed by your caregiver.  Do not drink alcohol, especially if you are breastfeeding or taking medicine to relieve pain.  Do not chew or smoke tobacco.  Continue to use good perineal care. Good perineal care includes:  Wiping your perineum from front to back.  Keeping your perineum clean.  Check your cut (incision) daily for increased redness, drainage, swelling, or separation of skin.  Clean your incision  gently with soap and water every day, and then pat it dry. If your caregiver says it is okay, leave the incision uncovered. Use a bandage (dressing) if the incision is draining fluid or appears irritated. If the adhesive strips across the incision do not fall off within 7 days, carefully peel them off.  Hug a pillow when coughing or sneezing until your incision is healed. This helps to relieve pain.  Do not use tampons or douche until your caregiver says it is okay.  Shower, wash your hair, and take tub baths as directed by your caregiver.  Wear a well-fitting bra that provides breast support.  Limit wearing support panties or control-top hose.  Drink enough fluids to keep your urine clear or pale yellow.  Eat high-fiber foods such as whole grain cereals and breads, brown rice, beans, and fresh fruits and vegetables every day. These foods may help prevent or relieve constipation.  Resume activities such as climbing stairs, driving, lifting, exercising, or traveling as directed by your caregiver.  Talk to your caregiver about resuming sexual activities. This is dependent upon your risk of infection, your rate of healing, and your comfort and desire to resume sexual activity.  Try to have someone help you with your household activities and your newborn for at least a few days after you leave the hospital.  Rest as much as possible. Try to rest or take a nap when your newborn is sleeping.  Increase your activities gradually.  Keep all of your scheduled postpartum appointments. It is very important to keep your scheduled follow-up appointments. At these appointments, your caregiver will be checking to make sure that you are healing physically and emotionally. SEEK MEDICAL CARE IF:   You are passing large clots from your vagina. Save any clots to show your caregiver.  You have a foul smelling discharge from your vagina.  You have trouble urinating.  You are urinating frequently.  You  have pain when you urinate.  You have a change in your bowel movements.  You have increasing redness, pain, or swelling near your incision.  You have pus draining from your incision.  Your incision is separating.  You have painful, hard, or reddened breasts.  You have a severe headache.  You have blurred vision or see spots.  You feel sad or depressed.  You have thoughts of hurting yourself or your newborn.  You have questions about your care, the care of your newborn, or medicines.  You are dizzy or lightheaded.  You have a rash.  You have pain, redness, or swelling at the site of the removed intravenous access (IV) tube.  You have nausea or vomiting.  You stopped breastfeeding and have not had a menstrual period within 12 weeks of stopping.  You are not breastfeeding and have not had a menstrual period within 12 weeks of delivery.  You have a fever. SEEK IMMEDIATE MEDICAL CARE IF:  You have persistent pain.  You have chest pain.  You  have shortness of breath.  You faint.  You have leg pain.  You have stomach pain.  Your vaginal bleeding saturates 2 or more sanitary pads in 1 hour. MAKE SURE YOU:   Understand these instructions.  Will watch your condition.  Will get help right away if you are not doing well or get worse. Document Released: 07/22/2002 Document Revised: 07/24/2012 Document Reviewed: 06/26/2012 [facility] Patient Information 2014 [place].   Postpartum Depression and Baby Blues  The postpartum period begins right after the birth of a baby. During this time, there is often a great amount of joy and excitement. It is also a time of considerable changes in the life of the parent(s). Regardless of how many times a mother gives birth, each child brings new challenges and dynamics to the family. It is not unusual to have feelings of excitement accompanied by confusing shifts in moods, emotions, and thoughts. All mothers are at risk of  developing postpartum depression or the "baby blues." These mood changes can occur right after giving birth, or they may occur many months after giving birth. The baby blues or postpartum depression can be mild or severe. Additionally, postpartum depression can resolve rather quickly, or it can be a long-term condition. CAUSES Elevated hormones and their rapid decline are thought to be a main cause of postpartum depression and the baby blues. There are a number of hormones that radically change during and after pregnancy. Estrogen and progesterone usually decrease immediately after delivering your baby. The level of thyroid hormone and various cortisol steroids also rapidly drop. Other factors that play a major role in these changes include major life events and genetics.  RISK FACTORS If you have any of the following risks for the baby blues or postpartum depression, know what symptoms to watch out for during the postpartum period. Risk factors that may increase the likelihood of getting the baby blues or postpartum depression include:  Havinga personal or family history of depression.  Having depression while being pregnant.  Having premenstrual or oral contraceptive-associated mood issues.  Having exceptional life stress.  Having marital conflict.  Lacking a social support network.  Having a baby with special needs.  Having health problems such as diabetes. SYMPTOMS Baby blues symptoms include:  Brief fluctuations in mood, such as going from extreme happiness to sadness.  Decreased concentration.  Difficulty sleeping.  Crying spells, tearfulness.  Irritability.  Anxiety. Postpartum depression symptoms typically begin within the first month after giving birth. These symptoms include:  Difficulty sleeping or excessive sleepiness.  Marked weight loss.  Agitation.  Feelings of worthlessness.  Lack of interest in activity or food. Postpartum psychosis is a very concerning  condition and can be dangerous. Fortunately, it is rare. Displaying any of the following symptoms is cause for immediate medical attention. Postpartum psychosis symptoms include:  Hallucinations and delusions.  Bizarre or disorganized behavior.  Confusion or disorientation. DIAGNOSIS  A diagnosis is made by an evaluation of your symptoms. There are no medical or lab tests that lead to a diagnosis, but there are various questionnaires that a caregiver may use to identify those with the baby blues, postpartum depression, or psychosis. Often times, a screening tool called the [place] Postnatal Depression Scale is used to diagnose depression in the postpartum period.  TREATMENT The baby blues usually goes away on its own in 1 to 2 weeks. Social support is often all that is needed. You should be encouraged to get adequate sleep and rest. Occasionally, you may be  given medicines to help you sleep.  Postpartum depression requires treatment as it can last several months or longer if it is not treated. Treatment may include individual or group therapy, medicine, or both to address any social, physiological, and psychological factors that may play a role in the depression. Regular exercise, a healthy diet, rest, and social support may also be strongly recommended.  Postpartum psychosis is more serious and needs treatment right away. Hospitalization is often needed. HOME CARE INSTRUCTIONS  Get as much rest as you can. Nap when the baby sleeps.  Exercise regularly. Some women find yoga and walking to be beneficial.  Eat a balanced and nourishing diet.  Do little things that you enjoy. Have a cup of tea, take a bubble bath, read your favorite magazine, or listen to your favorite music.  Avoid alcohol.  Ask for help with household chores, cooking, grocery shopping, or running errands as needed. Do not try to do everything.  Talk to people close to you about how you are feeling. Get support from your  partner, family members, friends, or other new moms.  Try to stay positive in how you think. Think about the things you are grateful for.  Do not spend a lot of time alone.  Only take medicine as directed by your caregiver.  Keep all your postpartum appointments.  Let your caregiver know if you have any concerns. SEEK MEDICAL CARE IF: You are having a reaction or problems with your medicine. SEEK IMMEDIATE MEDICAL CARE IF:  You have suicidal feelings.  You feel you may harm the baby or someone else. Document Released: 08/03/2004 Document Revised: 01/22/2012 Document Reviewed: 09/05/2011 [facility] Patient Information 2014 [place], [place].  Discharge to: home  Follow-up Information   Follow up with [facility]. (Call for any questions or concerns.  Call Dr. Andy Gauss office to make f/u appointment)    Contact information:   [address] [place] [id number] [phone]      Follow up with Thurnell Lose, MD. Schedule an appointment as soon as possible for a visit in 1 week. (bp check)    Specialty:  Obstetrics and Gynecology   Contact information:   [address] [place] [address] [phone]        Donnel Saxon 12/07/2013

## 2013-12-07 DIAGNOSIS — IMO0002 Reserved for concepts with insufficient information to code with codable children: Principal | ICD-10-CM | POA: Diagnosis present

## 2013-12-07 DIAGNOSIS — O09299 Supervision of pregnancy with other poor reproductive or obstetric history, unspecified trimester: Secondary | ICD-10-CM

## 2013-12-07 DIAGNOSIS — Q249 Congenital malformation of heart, unspecified: Secondary | ICD-10-CM

## 2013-12-08 ENCOUNTER — Inpatient Hospital Stay (HOSPITAL_COMMUNITY): Admission: RE | Admit: 2013-12-08 | Payer: BC Managed Care – PPO | Source: Ambulatory Visit

## 2013-12-09 ENCOUNTER — Encounter (HOSPITAL_COMMUNITY): Admission: RE | Payer: Self-pay | Source: Ambulatory Visit

## 2013-12-09 ENCOUNTER — Inpatient Hospital Stay (HOSPITAL_COMMUNITY)
Admission: RE | Admit: 2013-12-09 | Payer: BC Managed Care – PPO | Source: Ambulatory Visit | Admitting: Obstetrics and Gynecology

## 2013-12-09 SURGERY — Surgical Case
Anesthesia: Regional

## 2013-12-10 ENCOUNTER — Ambulatory Visit (HOSPITAL_COMMUNITY)
Admit: 2013-12-10 | Discharge: 2013-12-10 | Disposition: A | Discharge status: Home / Self Care | Payer: BC Managed Care – PPO | Attending: Pediatrics | Admitting: Pediatrics

## 2013-12-10 NOTE — Lactation Note (Signed)
Infant Lactation Consultation Outpatient Visit Note  Patient Name: Emily Roman Date of Birth: 06/29/1981                                    12/03/13 Birth Weight:                                                       4 lbs 9 oz                Today    4 lbs 5.5 oz Gestational Age at Delivery: Gestational Age: <None>                    36 1/7  At birth,    [redacted] weeks gestation today corrected Type of Delivery:   Breastfeeding History Frequency of Breastfeeding:     Mom has stopped breast feeding due to baby losing weight and being just over 4 ponds, and very sleepy Length of Feeding:  Voids: 5.6 Stools: 5  Supplementing / Method: Pumping:  Type of Pump:Medela DEP   Frequency:every 3 hours  Volume:  60-160  Comments:   Baby taking 30 mls per feeding, by bottle, every 2-3 hours    Consultation Evaluation:      Outpatient lactation consult with this 66 week old baby, now 37 weeks corrected gestation, weighing 4 lbs - 9 ounces at birth, and today 4 ounces 5.5 ounces. I did a pre and post weight  To see what the baby took at the breast, and her transferred nothing. He suckled intermittently at the breast, and mom had milk in her nipple shield. We limited his time at the breast to 15 minutes. Mom then pumped 5 ounces of milk, and we offered Elijah a bottle of EBM. He would only take 26 mls, and then was sleepy. I suggested mom and dad feed him by bottle EBM every 3 hours, and for mom to separate her pumped milk into 2 parts, . This way, mom can freeze her foremilk, and feed the hind milk to Elijah, which is higher in fat and calories.   Initial Feeding Assessment: Pre-feed [id number] Post-feed Weight: Amount Transferred: Comments:  Additional Feeding Assessment: Pre-feed Weight: Post-feed Weight: Amount Transferred: Comments:  Additional Feeding Assessment: Pre-feed Weight: Post-feed Weight: Amount Transferred: Comments:  Total Breast milk  Transferred this Visit:  Total Supplement Given:   Additional Interventions:   Follow-Up    Mom has a pediatrician appointment in two days, and then a nurse to coming to their home next week. Mom will call for another outpatient consult, when Elijah is bigger, and we will then see if he can transition to breast feeding.      Tonna Corner 12/10/2013, 2:34 PM

## 2013-12-26 ENCOUNTER — Ambulatory Visit (HOSPITAL_COMMUNITY): Payer: BC Managed Care – PPO

## 2013-12-26 ENCOUNTER — Ambulatory Visit (HOSPITAL_COMMUNITY)
Admission: RE | Admit: 2013-12-26 | Discharge: 2013-12-26 | Disposition: A | Discharge status: Home / Self Care | Payer: BC Managed Care – PPO | Source: Ambulatory Visit | Attending: Obstetrics and Gynecology | Admitting: Obstetrics and Gynecology

## 2013-12-26 NOTE — Lactation Note (Signed)
Adult Lactation Consultation Outpatient Visit Note  Patient Name: Emily Roman(mother)    BABYEulah Citizen                                                                   DOB: [birth year] Date of Birth: 1981/10/12                             BIRTH WEIGHT: 4-9 Gestational Age at Delivery: 36.1               WEIGHT TODAY:5-14.4 Type of Delivery: C/S  Breastfeeding History: Frequency of Breastfeeding: ATTEMPT ONCE PER DAY Length of Feeding:  Voids: QS Stools: QS  Supplementing / Method:2.5-3 oz EBM EVERY 2.5-3 HOURS Pumping:  Type of Pump:pump in style   Frequency:7-8 times per day  Volume:  34-40 oz/24 hrs day  Comments:    Consultation Evaluation:Mom and infant here for follow up outpatient appointment from 12/10/13.  Baby was born LPT.  Baby did not transfer any milk at the last appointment so th plan was to continue pumping and bottle feeding.  Mom had a good milk supply.  Mom states she is pumping 40 oz in 24 hours.  She attempts to put baby to breast once per day but reports pain with 20 mm nipple shield.  Attempted to latch baby on without shield this AM without success.  24 mm nipple shield placed and baby latched well after a few attempts .  Baby active at breast but only transferred 10 mls.  Mom post pumped 3 oz from each breast.  Plan is to put baby to breast about 3 times per day using nipple shield then offering bottle of EBM as desired after breast. Mom will decrease pumping to 6 times per day but watch supply doesn't decrease too much.  Outpatient appointment scheduled for 01/09/14 1030.  Initial Feeding Assessment:RIGHT BREAST 15 MINUTES WITH 24 MM NIPPLE SHIELD Pre-feed [id number] Post-feed [id number] Amount Transferred:10 MLS Comments:  Additional Feeding Assessment: Pre-feed Weight: Post-feed Weight: Amount Transferred: Comments:  Additional Feeding Assessment: Pre-feed Weight: Post-feed Weight: Amount Transferred: Comments:  Total Breast milk Transferred  this Visit: 10 MLS Total Supplement Given: 60 MLS EBM  Additional Interventions:   Follow-Up OUTPATIENT APPOINTMENT 01/09/14 1030      [name] 12/26/2013, 9:34 AM

## 2014-01-15 ENCOUNTER — Ambulatory Visit: Payer: Self-pay

## 2014-01-15 NOTE — Lactation Note (Signed)
This note was copied from the chart of [name]. Infant Lactation Consultation Outpatient Visit Note  Patient Name: Emily Roman Date of Birth: 12/03/2014 Birth Weight:  4 lb 9.2 oz (2075 g) Gestational Age at Delivery: Gestational Age: [redacted]w[redacted]d Type of Delivery: C/S WEIGHT TODAY:8-3.8 Breastfeeding History Frequency of Breastfeeding: twice per day Length of Feeding: 10-15 minutes one brewast Voids: qs Stools: qs  Supplementing / Method:EBM 2-3 oz every 3-4 hours per bottle Pumping:  Type of Pump:medela   Frequency:6 times per day  Volume:  6 oz(3 from each breast)  Comments:    Consultation Evaluation:Mom and 24 week old infant here for feeding assessment.  Baby was born at 36.1 weeks.  Mom has been pumping on a regular basis and has a very good milk supply.  She has been putting baby to breast twice per day for a week.  She states he gets fussy after about ten minutes and she gives a bottle.  Excellent weight gain.  Mom has been using a 24 mm nipple shield.  Baby started out well on right breast with active suck swallows for 5 minutes then starting pulling off crying frequently when milk was letting down forcefully.  Discussed reclining back, sitting baby upright and pre pumping.  Attempted to latch without shield but baby became fussy and pulled off.  Baby very fussy on left breast and would not feed.  Mom states her letdown is slow on this side and baby seemed frustrated initiating letdown.  Mom will try to hand express or pre pump to initiate letdown before latch.  Discussed baby's ease with bottle for 6 weeks and transitioning to breast will take some time. Mom will attempt to put baby to breast with most feedings and continue to pump and offer bottle if poor or no feeding at breast.  Mom will return to work in 2 weeks and pumping at work discussed.  Questions answered.  Encouraged her to call [facility] office for any concerns.  Initial Feeding Assessment: right breast 15 minutes( baby was  already feeding before consult so pre-post not accurate) Pre-feed [id number] Post-feed [id number] Amount Transferred:20 mls Comments:  Additional Feeding Assessment: Pre-feed Weight: Post-feed Weight: Amount Transferred: Comments:  Additional Feeding Assessment: Pre-feed Weight: Post-feed Weight: Amount Transferred: Comments:  Total Breast milk Transferred this Visit: 20 mls Total Supplement Given: 20 mls EBM  Additional Interventions:   Follow-Up Will call prn      [name] 01/15/2014, 12:06 PM

## 2014-09-14 ENCOUNTER — Encounter (HOSPITAL_COMMUNITY): Payer: Self-pay | Admitting: Obstetrics and Gynecology

## 2016-11-17 ENCOUNTER — Encounter: Payer: Self-pay | Admitting: Gastroenterology

## 2016-11-20 ENCOUNTER — Encounter: Payer: Self-pay | Admitting: *Deleted

## 2016-11-24 ENCOUNTER — Encounter: Payer: Self-pay | Admitting: Gastroenterology

## 2016-11-24 ENCOUNTER — Ambulatory Visit (INDEPENDENT_AMBULATORY_CARE_PROVIDER_SITE_OTHER): Payer: BLUE CROSS/BLUE SHIELD | Admitting: Gastroenterology

## 2016-11-24 ENCOUNTER — Other Ambulatory Visit (INDEPENDENT_AMBULATORY_CARE_PROVIDER_SITE_OTHER): Payer: BLUE CROSS/BLUE SHIELD

## 2016-11-24 VITALS — BP 108/74 | Ht 67.0 in | Wt 127.0 lb

## 2016-11-24 DIAGNOSIS — K589 Irritable bowel syndrome without diarrhea: Secondary | ICD-10-CM

## 2016-11-24 DIAGNOSIS — K529 Noninfective gastroenteritis and colitis, unspecified: Secondary | ICD-10-CM | POA: Insufficient documentation

## 2016-11-24 LAB — CBC WITH DIFFERENTIAL/PLATELET
BASOS PCT: 0.5 % (ref 0.0–3.0)
Basophils Absolute: 0 10*3/uL (ref 0.0–0.1)
EOS PCT: 1.9 % (ref 0.0–5.0)
Eosinophils Absolute: 0.1 10*3/uL (ref 0.0–0.7)
HCT: 36.5 % (ref 36.0–46.0)
HEMOGLOBIN: 12.3 g/dL (ref 12.0–15.0)
LYMPHS PCT: 44.1 % (ref 12.0–46.0)
Lymphs Abs: 1.9 10*3/uL (ref 0.7–4.0)
MCHC: 33.8 g/dL (ref 30.0–36.0)
MCV: 91.8 fl (ref 78.0–100.0)
MONOS PCT: 7.7 % (ref 3.0–12.0)
Monocytes Absolute: 0.3 10*3/uL (ref 0.1–1.0)
Neutro Abs: 1.9 10*3/uL (ref 1.4–7.7)
Neutrophils Relative %: 45.8 % (ref 43.0–77.0)
Platelets: 363 10*3/uL (ref 150.0–400.0)
RBC: 3.98 Mil/uL (ref 3.87–5.11)
RDW: 13.5 % (ref 11.5–15.5)
WBC: 4.2 10*3/uL (ref 4.0–10.5)

## 2016-11-24 LAB — TSH: TSH: 1.42 u[IU]/mL (ref 0.35–4.50)

## 2016-11-24 LAB — SEDIMENTATION RATE: Sed Rate: 14 mm/hr (ref 0–20)

## 2016-11-24 LAB — COMPREHENSIVE METABOLIC PANEL
ALT: 16 U/L (ref 0–35)
AST: 13 U/L (ref 0–37)
Albumin: 3.8 g/dL (ref 3.5–5.2)
Alkaline Phosphatase: 39 U/L (ref 39–117)
BUN: 12 mg/dL (ref 6–23)
CALCIUM: 9.4 mg/dL (ref 8.4–10.5)
CHLORIDE: 108 meq/L (ref 96–112)
CO2: 31 mEq/L (ref 19–32)
CREATININE: 0.83 mg/dL (ref 0.40–1.20)
GFR: 99.57 mL/min (ref >60.00)
Glucose, Bld: 68 mg/dL — ABNORMAL LOW (ref 70–99)
Potassium: 3.9 mEq/L (ref 3.5–5.1)
Sodium: 144 mEq/L (ref 135–145)
Total Bilirubin: 0.9 mg/dL (ref 0.2–1.2)
Total Protein: 6.7 g/dL (ref 6.0–8.3)

## 2016-11-24 LAB — IGA: IgA: 107 mg/dL (ref 68–378)

## 2016-11-24 LAB — C-REACTIVE PROTEIN: CRP: 0.2 mg/dL — ABNORMAL LOW (ref 0.5–20.0)

## 2016-11-24 NOTE — Progress Notes (Signed)
11/24/2016 Emily Roman [id number] May 01, 1981   HISTORY OF PRESENT ILLNESS:  This is a 36 year old female who is new to our office and has been referred here by Carolee Rota, NP, for complaints of diarrhea.  She tells me that she has a long-standing history of IBS.  Says that she had a colonoscopy performed in [facility] about 10 years ago at which time she was told that it was normal except for hemorrhoids and was told that she had IBS.  She tells me that her normal bowel movements are anywhere from 4-6 stools per day and they are always loose/liquidy/mushy.  She does not eat if she knows she has to go somewhere and does not eat at work throughout the day for fear of needing to have a bowel movement.  She says that this is just her normal now she has learned to live with it. She denies blood in her stool.  Says that she has tried lactose-free diet and removing gluten from her diet but nothing seems to make a difference.  Recently took an antibiotic for UTI and diarrhea worsened as well as development of nausea, vomiting, abdominal pain, poor appetite with associated 10 pound weight loss. She has now completed the antibiotic and is taking a daily probiotic. Is feeling much better and back to her baseline.  Past Medical History:  Diagnosis Date  . Wolff-Parkinson-White (WPW) syndrome 2007   ablation done- no further probs   Past Surgical History:  Procedure Laterality Date  . ABLATION  2007   cardiac ablation  . CESAREAN SECTION N/A 12/03/2013   Procedure: CESAREAN SECTION;  Surgeon: Thurnell Lose, MD;  Location: [facility];  Service: Obstetrics;  Laterality: N/A;  . EYE SURGERY    . LAPAROSCOPIC LYSIS OF ADHESIONS N/A 01/03/2013   Procedure: LAPAROSCOPIC LYSIS OF ADHESIONS;  Surgeon: Governor Specking, MD;  Location: [facility];  Service: Gynecology;  Laterality: N/A;  . 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      reports that she has never smoked. She has never used smokeless tobacco. She reports that she drinks alcohol. She reports that she does not use drugs. family history includes Kidney disease in her father. Allergies  Allergen Reactions  . Shellfish Allergy Anaphylaxis      Outpatient Encounter Prescriptions as of 11/24/2016  Medication Sig  . Norethin Ace-Eth Estrad-FE (MIBELAS 24 FE PO) Take by mouth once.  . Prenatal Vit-Fe Fumarate-FA (PRENATAL MULTIVITAMIN) TABS Take 1 tablet by mouth daily.  . [DISCONTINUED] ibuprofen (ADVIL,MOTRIN) 600 MG tablet Take 1 tablet (600 mg total) by mouth every 6 (six) hours as needed.  . [DISCONTINUED] oxyCODONE-acetaminophen (PERCOCET/ROXICET) 5-325 MG per tablet Take 1-2 tablets by mouth every 4 (four) hours as needed for severe pain (moderate - severe pain).   No facility-administered encounter medications on file as of 11/24/2016.      REVIEW OF SYSTEMS  : All other systems reviewed and negative except where noted in the History of Present Illness.   PHYSICAL EXAM: BP 108/74   Ht 5\' 7"  (1.702 m)   Wt 127 lb (57.6 kg)   LMP 11/23/2016   BMI 19.89 kg/m  General: Well developed black female in no acute distress Head: Normocephalic and atraumatic Eyes:  Sclerae anicteric, conjunctiva pink. Ears: Normal auditory acuity Lungs: Clear throughout to auscultation Heart:  Regular rate and rhythm Abdomen: Soft, non-distended.  Normal bowel sounds.  Non-tender. Musculoskeletal: Symmetrical with no gross deformities  Skin: No lesions on visible extremities Extremities: No edema  Neurological: Alert oriented x 4, grossly non-focal Psychological:  Alert and cooperative. Normal mood and affect  ASSESSMENT AND PLAN: -36 year old female with long-standing history of IBS-D.  Recently took an antibiotic for UTI and diarrhea worsened as well as development  of nausea, vomiting, abdominal pain, poor appetite with associated 10 pound weight loss. She has now completed the antibiotic and is taking a daily probiotic. Is feeling much better and back to her baseline.  She likely just had side effects of the antibiotic which upset her gut and worsened her IBS symptoms. She'll continue probiotic. Regarding her long-standing diarrhea we will check some labs including basic CBC, CMP, TSH, and will do celiac studies.  Gave her information on FODMAP diet.  I discussed possibly repeating colonoscopy so that we could have one for our records (and possibly biopsy for microscopic colitis).  Discussed with her that if this IBS then could manage with Imodium but also discussed Viberzi trial.  She decided to hold off with repeat colonoscopy and other medications for now.   CC:  Carolee Rota, NP

## 2016-11-24 NOTE — Patient Instructions (Signed)
Your physician has requested that you go to the basement for the following lab work before leaving today: CBC, CMET, TSH, ESR, CRP, IGA, TTG  Please call us back should you have ongoing or worsening symptoms.  We have given you a FODMAP diet to look over and follow.  Continue your probiotic.  If you are age 37 or older, your body mass index should be between 23-30. Your Body mass index is 19.89 kg/m. If this is out of the aforementioned range listed, please consider follow up with your Primary Care Provider.  If you are age 60 or younger, your body mass index should be between 19-25. Your Body mass index is 19.89 kg/m. If this is out of the aformentioned range listed, please consider follow up with your Primary Care Provider.

## 2016-11-24 NOTE — Progress Notes (Signed)
I agree with the above note, plan 

## 2016-11-24 NOTE — Addendum Note (Signed)
Addended by: Larina Bras on: 11/24/2016 04:05 PM   Modules accepted: Orders

## 2016-11-27 LAB — TISSUE TRANSGLUTAMINASE, IGA: Tissue Transglutaminase Ab, IgA: 1 U/mL (ref <4)

## 2016-12-22 ENCOUNTER — Other Ambulatory Visit: Payer: Self-pay | Admitting: Obstetrics and Gynecology

## 2016-12-22 ENCOUNTER — Other Ambulatory Visit (HOSPITAL_COMMUNITY)
Admission: RE | Admit: 2016-12-22 | Discharge: 2016-12-22 | Disposition: A | Discharge status: Home / Self Care | Payer: BLUE CROSS/BLUE SHIELD | Source: Ambulatory Visit | Attending: Obstetrics and Gynecology | Admitting: Obstetrics and Gynecology

## 2016-12-22 DIAGNOSIS — Z01419 Encounter for gynecological examination (general) (routine) without abnormal findings: Secondary | ICD-10-CM | POA: Insufficient documentation

## 2016-12-22 DIAGNOSIS — Z1151 Encounter for screening for human papillomavirus (HPV): Secondary | ICD-10-CM | POA: Insufficient documentation

## 2016-12-26 LAB — CYTOLOGY - PAP
Diagnosis: NEGATIVE
HPV: NOT DETECTED

## 2018-12-30 ENCOUNTER — Other Ambulatory Visit: Payer: Self-pay | Admitting: Obstetrics and Gynecology

## 2019-12-04 ENCOUNTER — Other Ambulatory Visit: Payer: Self-pay | Admitting: Allergy and Immunology

## 2019-12-04 ENCOUNTER — Ambulatory Visit
Admission: RE | Admit: 2019-12-04 | Discharge: 2019-12-04 | Disposition: A | Discharge status: Home / Self Care | Payer: BC Managed Care – PPO | Source: Ambulatory Visit | Attending: Allergy and Immunology | Admitting: Allergy and Immunology

## 2019-12-04 DIAGNOSIS — J4520 Mild intermittent asthma, uncomplicated: Secondary | ICD-10-CM

## 2020-09-19 IMAGING — DX DG CHEST 2V
2 series · 2 of 2 positions shown · non-contrast
Comparison: None.

CLINICAL DATA: Asthma with shortness of breath

EXAM:
CHEST - 2 VIEW

[dg chest 2 view (1 of 2)]
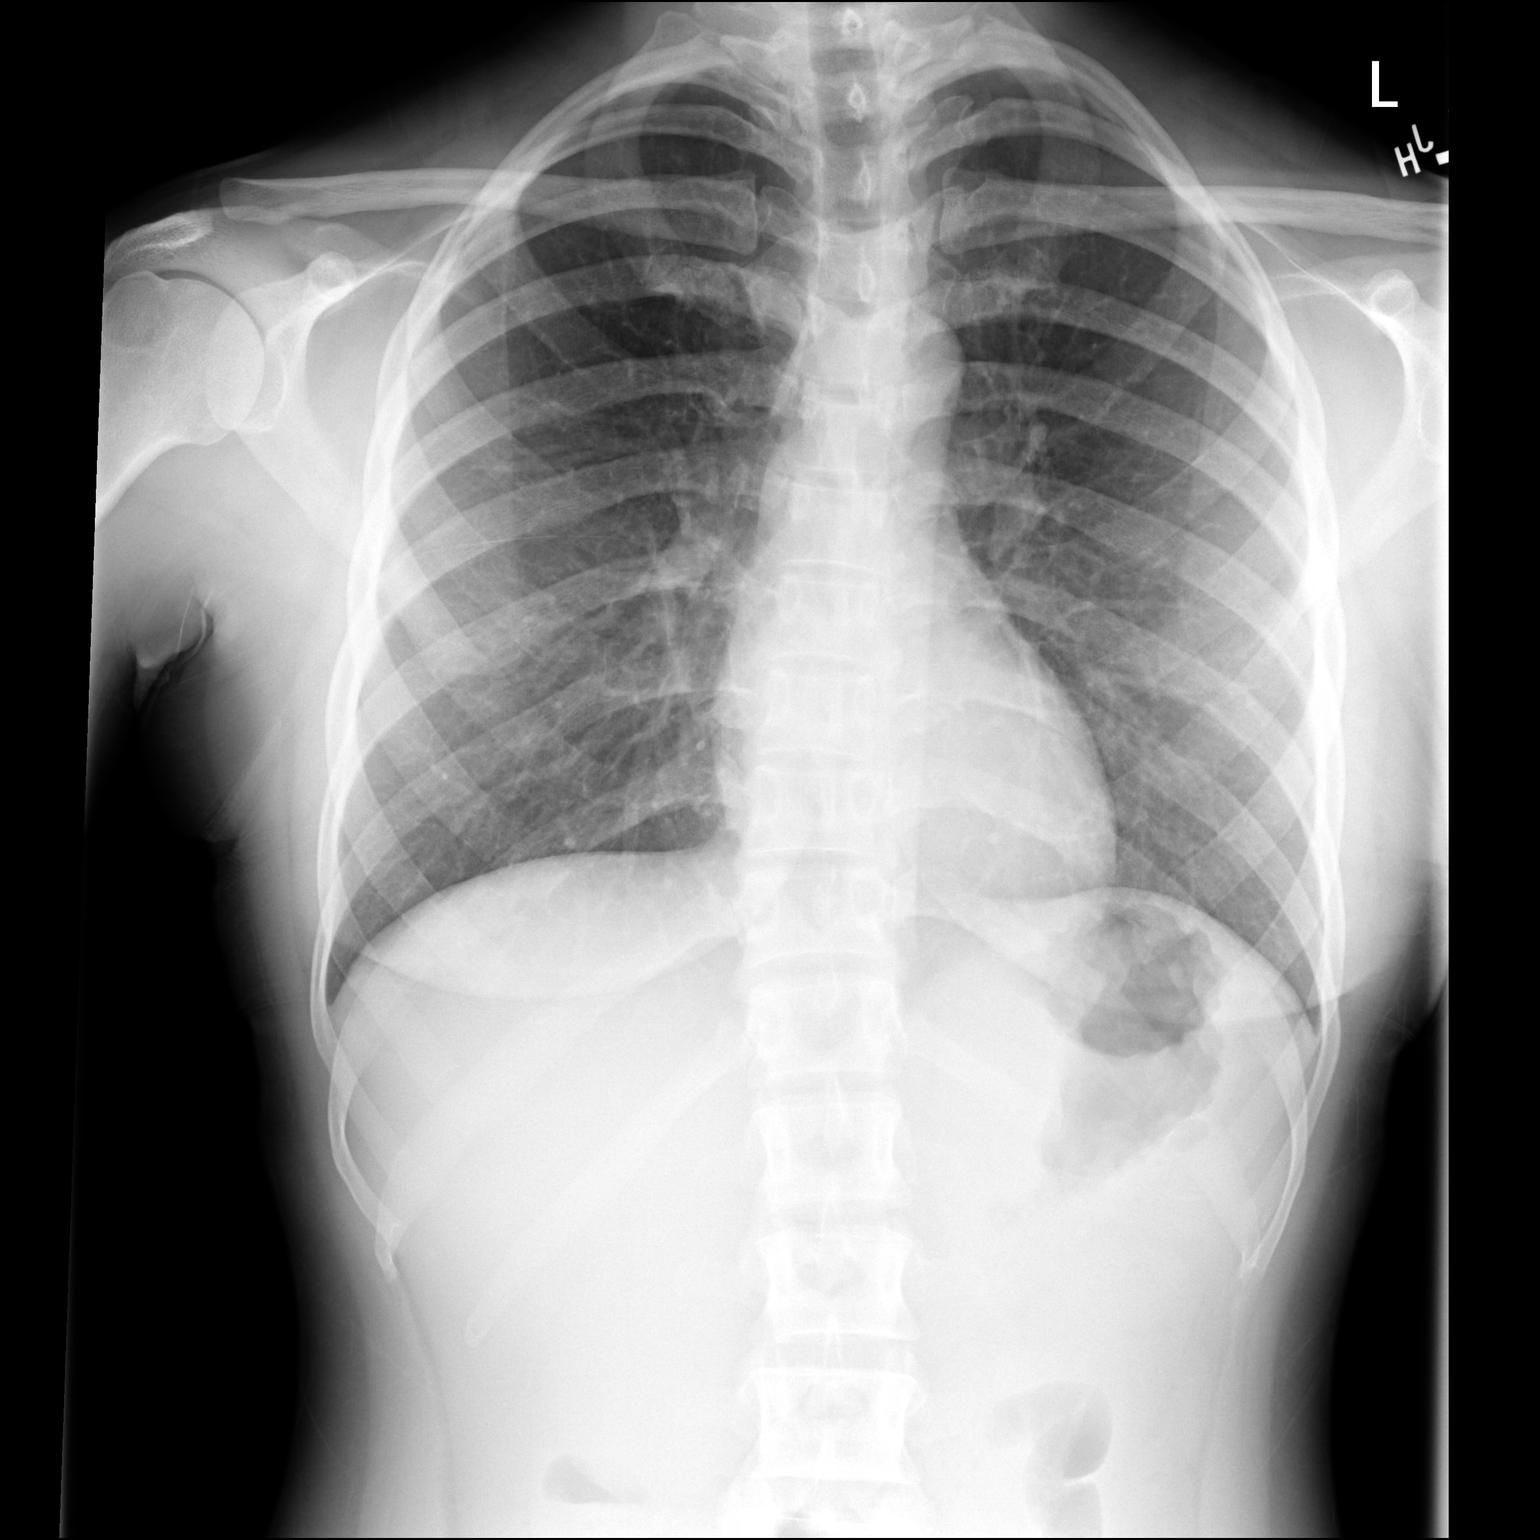

[dg chest 2 view (2 of 2)]
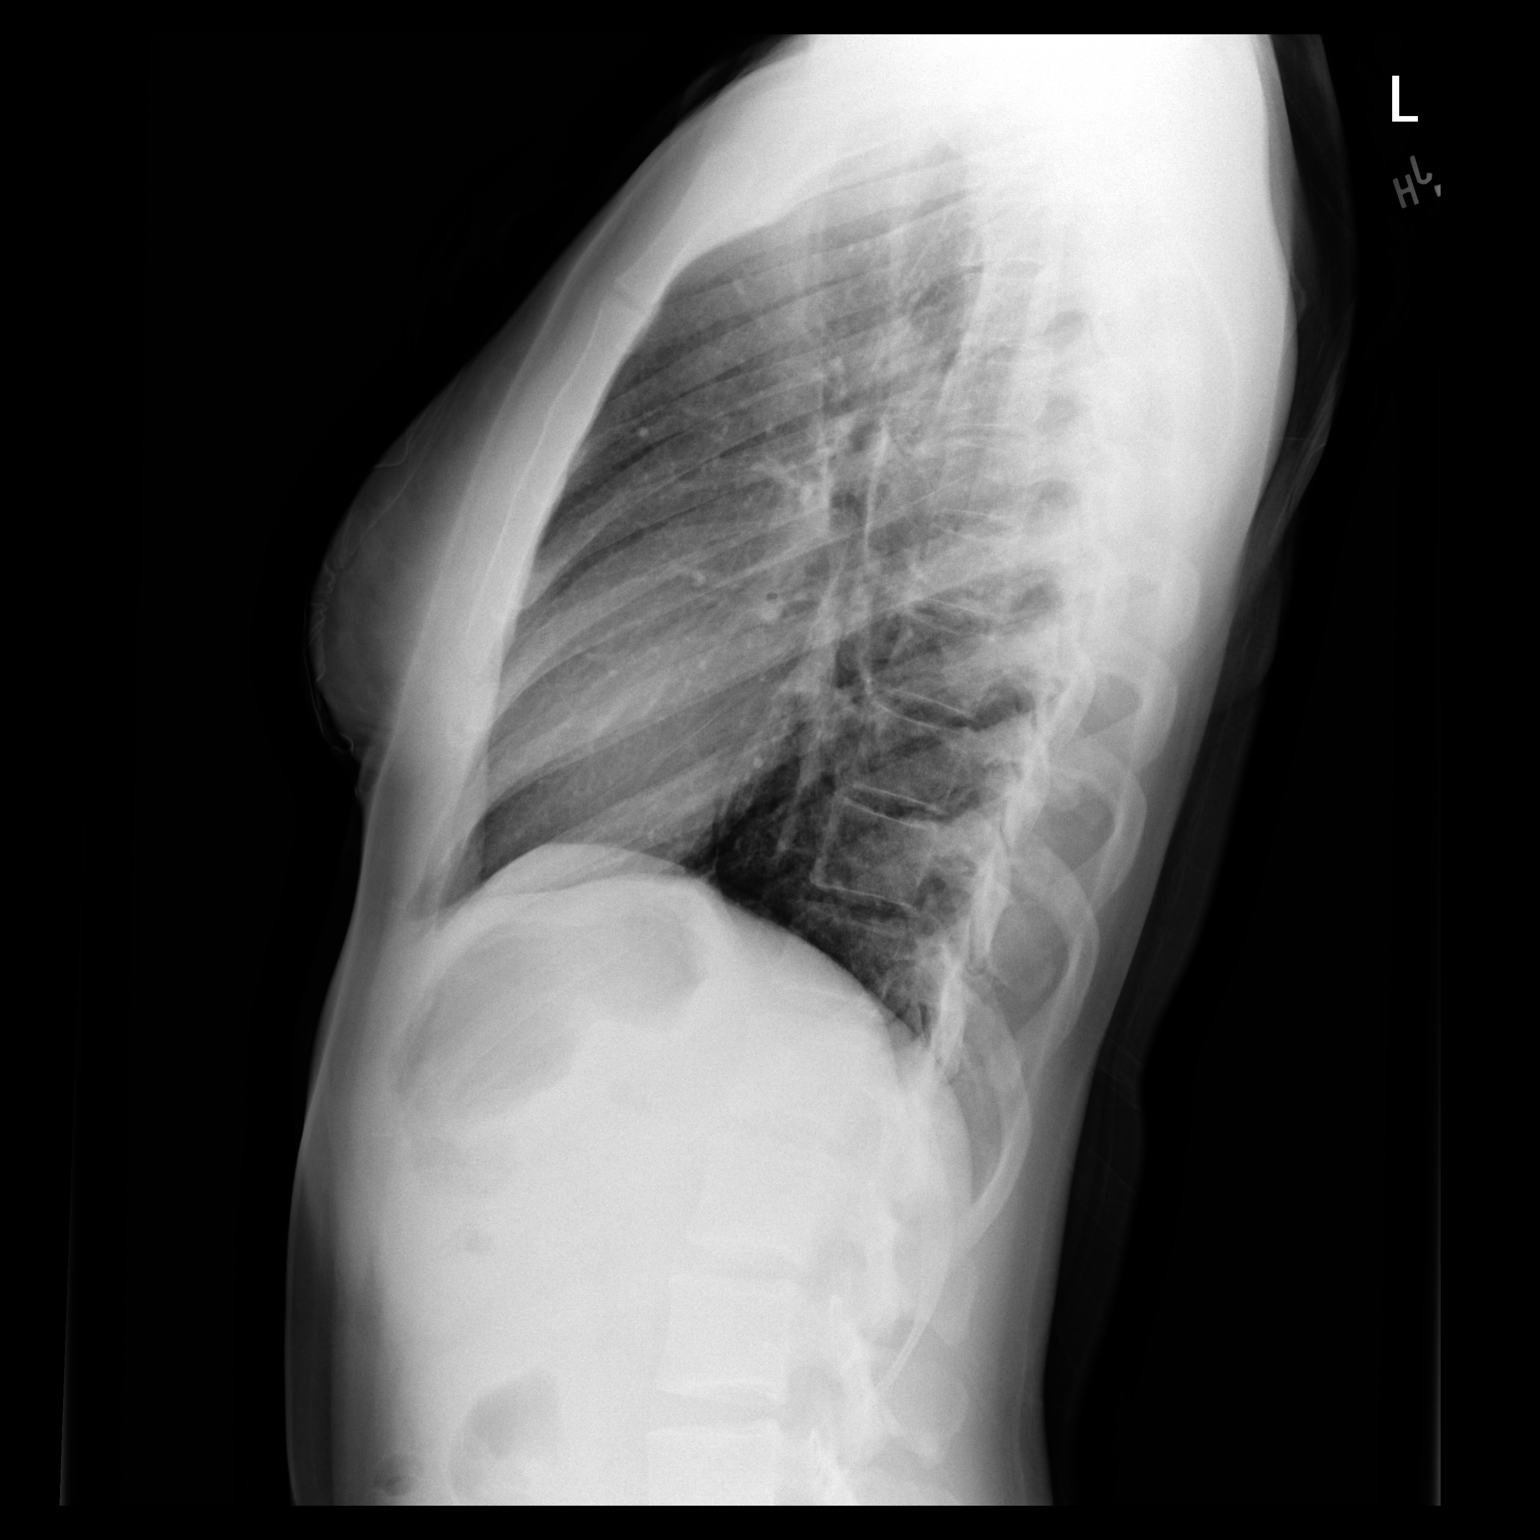

[2 of 2 positions shown; findings below may reference images not displayed]

FINDINGS: Lungs are clear. Heart size and pulmonary vascularity are normal. No
adenopathy. There is mild upper thoracic levoscoliosis.
IMPRESSION: Lungs clear.  Cardiac silhouette normal.  No adenopathy.

## 2022-01-09 ENCOUNTER — Other Ambulatory Visit: Payer: Self-pay | Admitting: Obstetrics and Gynecology

## 2022-01-09 DIAGNOSIS — Z1231 Encounter for screening mammogram for malignant neoplasm of breast: Secondary | ICD-10-CM

## 2022-01-23 ENCOUNTER — Ambulatory Visit
Admission: RE | Admit: 2022-01-23 | Discharge: 2022-01-23 | Disposition: A | Discharge status: Home / Self Care | Payer: BC Managed Care – PPO | Source: Ambulatory Visit | Attending: Obstetrics and Gynecology | Admitting: Obstetrics and Gynecology

## 2022-01-23 DIAGNOSIS — Z1231 Encounter for screening mammogram for malignant neoplasm of breast: Secondary | ICD-10-CM | POA: Diagnosis not present

## 2022-06-12 DIAGNOSIS — M25461 Effusion, right knee: Secondary | ICD-10-CM | POA: Diagnosis not present

## 2022-06-12 DIAGNOSIS — M25561 Pain in right knee: Secondary | ICD-10-CM | POA: Diagnosis not present

## 2022-07-24 DIAGNOSIS — M25562 Pain in left knee: Secondary | ICD-10-CM | POA: Diagnosis not present

## 2022-07-24 DIAGNOSIS — M25462 Effusion, left knee: Secondary | ICD-10-CM | POA: Diagnosis not present

## 2022-08-31 DIAGNOSIS — J340 Abscess, furuncle and carbuncle of nose: Secondary | ICD-10-CM | POA: Diagnosis not present

## 2022-08-31 DIAGNOSIS — M25461 Effusion, right knee: Secondary | ICD-10-CM | POA: Diagnosis not present

## 2022-08-31 DIAGNOSIS — Z8759 Personal history of other complications of pregnancy, childbirth and the puerperium: Secondary | ICD-10-CM | POA: Diagnosis not present

## 2022-08-31 DIAGNOSIS — Z8269 Family history of other diseases of the musculoskeletal system and connective tissue: Secondary | ICD-10-CM | POA: Diagnosis not present

## 2022-08-31 DIAGNOSIS — R21 Rash and other nonspecific skin eruption: Secondary | ICD-10-CM | POA: Diagnosis not present

## 2022-08-31 DIAGNOSIS — M25462 Effusion, left knee: Secondary | ICD-10-CM | POA: Diagnosis not present

## 2022-08-31 DIAGNOSIS — R5383 Other fatigue: Secondary | ICD-10-CM | POA: Diagnosis not present

## 2022-09-25 DIAGNOSIS — R600 Localized edema: Secondary | ICD-10-CM | POA: Diagnosis not present

## 2022-09-25 DIAGNOSIS — M659 Synovitis and tenosynovitis, unspecified: Secondary | ICD-10-CM | POA: Diagnosis not present

## 2022-09-25 DIAGNOSIS — M25461 Effusion, right knee: Secondary | ICD-10-CM | POA: Diagnosis not present

## 2022-09-25 DIAGNOSIS — M65861 Other synovitis and tenosynovitis, right lower leg: Secondary | ICD-10-CM | POA: Diagnosis not present

## 2022-09-25 DIAGNOSIS — M1711 Unilateral primary osteoarthritis, right knee: Secondary | ICD-10-CM | POA: Diagnosis not present

## 2022-09-29 DIAGNOSIS — R21 Rash and other nonspecific skin eruption: Secondary | ICD-10-CM | POA: Diagnosis not present

## 2022-09-29 DIAGNOSIS — M25461 Effusion, right knee: Secondary | ICD-10-CM | POA: Diagnosis not present

## 2022-09-29 DIAGNOSIS — M25462 Effusion, left knee: Secondary | ICD-10-CM | POA: Diagnosis not present

## 2022-09-29 DIAGNOSIS — J340 Abscess, furuncle and carbuncle of nose: Secondary | ICD-10-CM | POA: Diagnosis not present

## 2022-11-23 DIAGNOSIS — Z8759 Personal history of other complications of pregnancy, childbirth and the puerperium: Secondary | ICD-10-CM | POA: Diagnosis not present

## 2022-11-23 DIAGNOSIS — M25462 Effusion, left knee: Secondary | ICD-10-CM | POA: Diagnosis not present

## 2022-11-23 DIAGNOSIS — M25461 Effusion, right knee: Secondary | ICD-10-CM | POA: Diagnosis not present

## 2022-11-23 DIAGNOSIS — R5382 Chronic fatigue, unspecified: Secondary | ICD-10-CM | POA: Diagnosis not present

## 2023-01-12 DIAGNOSIS — Z01419 Encounter for gynecological examination (general) (routine) without abnormal findings: Secondary | ICD-10-CM | POA: Diagnosis not present

## 2023-02-12 DIAGNOSIS — M328 Other forms of systemic lupus erythematosus: Secondary | ICD-10-CM | POA: Diagnosis not present

## 2023-02-12 DIAGNOSIS — H53031 Strabismic amblyopia, right eye: Secondary | ICD-10-CM | POA: Diagnosis not present

## 2023-02-12 DIAGNOSIS — H35411 Lattice degeneration of retina, right eye: Secondary | ICD-10-CM | POA: Diagnosis not present

## 2023-02-12 DIAGNOSIS — H40013 Open angle with borderline findings, low risk, bilateral: Secondary | ICD-10-CM | POA: Diagnosis not present

## 2023-02-12 DIAGNOSIS — H3589 Other specified retinal disorders: Secondary | ICD-10-CM | POA: Diagnosis not present

## 2023-02-12 DIAGNOSIS — Z79899 Other long term (current) drug therapy: Secondary | ICD-10-CM | POA: Diagnosis not present

## 2023-04-26 ENCOUNTER — Other Ambulatory Visit: Payer: Self-pay | Admitting: Obstetrics and Gynecology

## 2023-04-26 DIAGNOSIS — Z1231 Encounter for screening mammogram for malignant neoplasm of breast: Secondary | ICD-10-CM

## 2023-05-16 ENCOUNTER — Ambulatory Visit
Admission: RE | Admit: 2023-05-16 | Discharge: 2023-05-16 | Disposition: A | Discharge status: Home / Self Care | Payer: BC Managed Care – PPO | Source: Ambulatory Visit | Attending: Obstetrics and Gynecology | Admitting: Obstetrics and Gynecology

## 2023-05-16 DIAGNOSIS — Z1231 Encounter for screening mammogram for malignant neoplasm of breast: Secondary | ICD-10-CM

## 2023-06-07 DIAGNOSIS — Z79899 Other long term (current) drug therapy: Secondary | ICD-10-CM | POA: Diagnosis not present

## 2023-06-07 DIAGNOSIS — R21 Rash and other nonspecific skin eruption: Secondary | ICD-10-CM | POA: Diagnosis not present

## 2023-06-07 DIAGNOSIS — M25461 Effusion, right knee: Secondary | ICD-10-CM | POA: Diagnosis not present

## 2023-08-06 DIAGNOSIS — Z0000 Encounter for general adult medical examination without abnormal findings: Secondary | ICD-10-CM | POA: Diagnosis not present

## 2023-08-06 DIAGNOSIS — Z23 Encounter for immunization: Secondary | ICD-10-CM | POA: Diagnosis not present

## 2023-09-03 DIAGNOSIS — R232 Flushing: Secondary | ICD-10-CM | POA: Diagnosis not present

## 2023-09-03 DIAGNOSIS — I10 Essential (primary) hypertension: Secondary | ICD-10-CM | POA: Diagnosis not present

## 2023-10-15 DIAGNOSIS — I10 Essential (primary) hypertension: Secondary | ICD-10-CM | POA: Diagnosis not present

## 2024-06-17 ENCOUNTER — Encounter: Payer: Self-pay | Admitting: Obstetrics and Gynecology
